# Patient Record
Sex: Male | Born: 1960
Health system: Southern US, Community
[De-identification: ages and names within clinical notes are randomized; demographics above are authoritative.]

## PROBLEM LIST (undated history)

## (undated) DIAGNOSIS — D369 Benign neoplasm, unspecified site: Secondary | ICD-10-CM

## (undated) DIAGNOSIS — F32A Depression, unspecified: Secondary | ICD-10-CM

## (undated) DIAGNOSIS — T7840XA Allergy, unspecified, initial encounter: Secondary | ICD-10-CM

## (undated) DIAGNOSIS — F329 Major depressive disorder, single episode, unspecified: Secondary | ICD-10-CM

## (undated) DIAGNOSIS — M7501 Adhesive capsulitis of right shoulder: Secondary | ICD-10-CM

## (undated) DIAGNOSIS — F419 Anxiety disorder, unspecified: Secondary | ICD-10-CM

## (undated) DIAGNOSIS — E039 Hypothyroidism, unspecified: Secondary | ICD-10-CM

## (undated) HISTORY — DX: Allergy, unspecified, initial encounter: T78.40XA

## (undated) HISTORY — DX: Benign neoplasm, unspecified site: D36.9

## (undated) HISTORY — PX: EYE SURGERY: SHX253

## (undated) HISTORY — DX: Adhesive capsulitis of right shoulder: M75.01

## (undated) HISTORY — DX: Anxiety disorder, unspecified: F41.9

## (undated) HISTORY — DX: Depression, unspecified: F32.A

## (undated) HISTORY — DX: Major depressive disorder, single episode, unspecified: F32.9

---

## 2004-09-16 ENCOUNTER — Encounter: Admission: RE | Admit: 2004-09-16 | Discharge: 2004-09-16 | Payer: Self-pay | Admitting: Internal Medicine

## 2007-02-06 ENCOUNTER — Ambulatory Visit: Payer: Self-pay | Admitting: Gastroenterology

## 2007-02-27 ENCOUNTER — Encounter (INDEPENDENT_AMBULATORY_CARE_PROVIDER_SITE_OTHER): Payer: Self-pay | Admitting: *Deleted

## 2007-02-27 ENCOUNTER — Ambulatory Visit: Payer: Self-pay | Admitting: Gastroenterology

## 2008-10-07 ENCOUNTER — Ambulatory Visit: Payer: Self-pay | Admitting: Internal Medicine

## 2009-03-31 ENCOUNTER — Ambulatory Visit: Payer: Self-pay | Admitting: Internal Medicine

## 2009-08-18 ENCOUNTER — Ambulatory Visit: Payer: Self-pay | Admitting: Family Medicine

## 2009-08-18 DIAGNOSIS — M25519 Pain in unspecified shoulder: Secondary | ICD-10-CM | POA: Insufficient documentation

## 2009-08-18 DIAGNOSIS — M7500 Adhesive capsulitis of unspecified shoulder: Secondary | ICD-10-CM | POA: Insufficient documentation

## 2009-10-06 ENCOUNTER — Ambulatory Visit: Payer: Self-pay | Admitting: Internal Medicine

## 2010-02-05 ENCOUNTER — Encounter (INDEPENDENT_AMBULATORY_CARE_PROVIDER_SITE_OTHER): Payer: Self-pay | Admitting: *Deleted

## 2010-03-23 ENCOUNTER — Encounter (INDEPENDENT_AMBULATORY_CARE_PROVIDER_SITE_OTHER): Payer: Self-pay | Admitting: *Deleted

## 2010-04-09 ENCOUNTER — Encounter (INDEPENDENT_AMBULATORY_CARE_PROVIDER_SITE_OTHER): Payer: Self-pay | Admitting: *Deleted

## 2010-04-13 ENCOUNTER — Ambulatory Visit: Payer: Self-pay | Admitting: Gastroenterology

## 2010-05-04 ENCOUNTER — Ambulatory Visit: Payer: Self-pay | Admitting: Gastroenterology

## 2010-05-06 ENCOUNTER — Encounter: Payer: Self-pay | Admitting: Gastroenterology

## 2010-05-11 ENCOUNTER — Ambulatory Visit: Payer: Self-pay | Admitting: Internal Medicine

## 2010-05-20 ENCOUNTER — Ambulatory Visit: Payer: Self-pay | Admitting: Emergency Medicine

## 2010-05-20 DIAGNOSIS — L255 Unspecified contact dermatitis due to plants, except food: Secondary | ICD-10-CM | POA: Insufficient documentation

## 2010-05-20 DIAGNOSIS — J309 Allergic rhinitis, unspecified: Secondary | ICD-10-CM | POA: Insufficient documentation

## 2010-05-20 DIAGNOSIS — F411 Generalized anxiety disorder: Secondary | ICD-10-CM | POA: Insufficient documentation

## 2010-05-21 ENCOUNTER — Telehealth: Payer: Self-pay | Admitting: Family Medicine

## 2010-12-29 NOTE — Letter (Signed)
Summary: Colonoscopy Letter  [facility]  [address] [place], [place] [address]   Phone: [phone]  Fax: [phone]      February 05, 2010 MRN: [id number]   CHARES SLAYMAKER [address] [place], [place]  [address]   Dear Mr. Mummert,   According to your medical record, it is time for you to schedule a Colonoscopy. The American Cancer Society recommends this procedure as a method to detect early colon cancer. Patients with a family history of colon cancer, or a personal history of colon polyps or inflammatory bowel disease are at increased risk.  This letter has beeen generated based on the recommendations made at the time of your procedure. If you feel that in your particular situation this may no longer apply, please contact our office.  Please call our office at [phone] to schedule this appointment or to update your records at your earliest convenience.  Thank you for cooperating with [place] to provide you with the very best care possible.   Sincerely,   Vania Rea. Jarold Motto, M.D.  [facility] Gastroenterology Division [phone]

## 2010-12-29 NOTE — Letter (Signed)
Summary: Previsit letter  [facility]  [address] [place], [place] [address]   Phone: [phone]  Fax: [phone]       03/23/2010 MRN: [id number]  Andrew Blake [address] [place], [place]  [address]  Dear [name],  Welcome to the Gastroenterology Division at [facility].    You are scheduled to see a nurse for your pre-procedure visit on 04/20/2010 at 8:00am on the 3rd floor at [facility], [address]  We ask that you try to arrive at our office 15 minutes prior to your appointment time to allow for check-in.  Your nurse visit will consist of discussing your medical and surgical history, your immediate family medical history, and your medications.    Please bring a complete list of all your medications or, if you prefer, bring the medication bottles and we will list them.  We will need to be aware of both prescribed and over the counter drugs.  We will need to know exact dosage information as well.  If you are on blood thinners (Coumadin, Plavix, Aggrenox, Ticlid, etc.) please call our office today/prior to your appointment, as we need to consult with your physician about holding your medication.   Please be prepared to read and sign documents such as consent forms, a financial agreement, and acknowledgement forms.  If necessary, and with your consent, a friend or relative is welcome to sit-in on the nurse visit with you.  Please bring your insurance card so that we may make a copy of it.  If your insurance requires a referral to see a specialist, please bring your referral form from your primary care physician.  No co-pay is required for this nurse visit.     If you cannot keep your appointment, please call [phone] to cancel or reschedule prior to your appointment date.  This allows [place] the opportunity to schedule an appointment for another patient in need of care.    Thank you for choosing [facility] for your medical  needs.  We appreciate the opportunity to care for you.  Please visit [place] at our website  to learn more about our practice.                     Sincerely.                                                                                                                   The Gastroenterology Division

## 2010-12-29 NOTE — Letter (Signed)
Summary: Patient Notice- Colon Biospy Results  [facility]  [address] [place], [place] [address]   Phone: [phone]  Fax: [phone]        May 06, 2010 MRN: [id number]    Andrew Blake [address] [place], [place]  [address]   x Dear [name],  I am pleased to inform you that the biopsies taken during your recent colonoscopy did not show any evidence of cancer upon pathologic examination.  Additional information/recommendations:  __No further action is needed at this time.  Please follow-up with      your primary care physician for your other healthcare needs.  __Please call [phone] to schedule a return visit to review      your condition.  _x_Continue with the treatment plan as outlined on the day of your      exam.Ileal biopsies were all normal.  __You should have a repeat colonoscopy examination for this problem           in 10_ years.  Please call us if you are having persistent problems or have questions about your condition that have not been fully answered at this time.  Sincerely,  Mardella Layman MD [facility]   This letter has been electronically signed by your physician.  Appended Document: Patient Notice- Colon Biospy Results letter mailed.

## 2010-12-29 NOTE — Letter (Signed)
Summary: [facility] Instructions  [facility]  [address] [place], [place] [address]   Phone: [phone]  Fax: [phone]       Andrew Blake    [birth year]    MRN: [id number]        Procedure Day /Date:  Monday   05/04/2010     Arrival Time: 7:30 am      Procedure Time: 8:30 am     Location of Procedure:                    _x _  [facility] (4th Floor)                        PREPARATION FOR COLONOSCOPY WITH MOVIPREP   Starting 5 days prior to your procedure  04/29/10 do not eat nuts, seeds, popcorn, corn, beans, peas,  salads, or any raw vegetables.  Do not take any fiber supplements (e.g. Metamucil, Citrucel, and Benefiber).  THE DAY BEFORE YOUR PROCEDURE         DATE: Sunday   05/03/10  1.  Drink clear liquids the entire day-NO SOLID FOOD  2.  Do not drink anything colored red or purple.  Avoid juices with pulp.  No orange juice.  3.  Drink at least 64 oz. (8 glasses) of fluid/clear liquids during the day to prevent dehydration and help the prep work efficiently.  CLEAR LIQUIDS INCLUDE: Water Jello Ice Popsicles Tea (sugar ok, no milk/cream) Powdered fruit flavored drinks Coffee (sugar ok, no milk/cream) Gatorade Juice: apple, white grape, white cranberry  Lemonade Clear bullion, consomm, broth Carbonated beverages (any kind) Strained chicken noodle soup Hard Candy                             4.  In the morning, mix first dose of MoviPrep solution:    Empty 1 Pouch A and 1 Pouch B into the disposable container    Add lukewarm drinking water to the top line of the container. Mix to dissolve    Refrigerate (mixed solution should be used within 24 hrs)  5.  Begin drinking the prep at 5:00 p.m. The MoviPrep container is divided by 4 marks.   Every 15 minutes drink the solution down to the next mark (approximately 8 oz) until the full liter is complete.   6.  Follow completed prep with 16 oz of clear liquid of your choice (Nothing  red or purple).  Continue to drink clear liquids until bedtime.  7.  Before going to bed, mix second dose of MoviPrep solution:    Empty 1 Pouch A and 1 Pouch B into the disposable container    Add lukewarm drinking water to the top line of the container. Mix to dissolve    Refrigerate  THE DAY OF YOUR PROCEDURE      DATE: Monday  05/04/10  Beginning at 3:30 a.m. (5 hours before procedure):         1. Every 15 minutes, drink the solution down to the next mark (approx 8 oz) until the full liter is complete.  2. Follow completed prep with 16 oz. of clear liquid of your choice.    3. You may drink clear liquids until 6:30 am (2 HOURS BEFORE PROCEDURE).   MEDICATION INSTRUCTIONS  Unless otherwise instructed, you should take regular prescription medications with a small sip of water   as early  as possible the morning of your procedure.          OTHER INSTRUCTIONS  You will need a responsible adult at least 50 years of age to accompany you and drive you home.   This person must remain in the waiting room during your procedure.  Wear loose fitting clothing that is easily removed.  Leave jewelry and other valuables at home.  However, you may wish to bring a book to read or  an iPod/MP3 player to listen to music as you wait for your procedure to start.  Remove all body piercing jewelry and leave at home.  Total time from sign-in until discharge is approximately 2-3 hours.  You should go home directly after your procedure and rest.  You can resume normal activities the  day after your procedure.  The day of your procedure you should not:   Drive   Make legal decisions   Operate machinery   Drink alcohol   Return to work  You will receive specific instructions about eating, activities and medications before you leave.    The above instructions have been reviewed and explained to me by   Wyona Almas RN  Apr 13, 2010 8:53 AM     I fully understand and can  verbalize these instructions _____________________________ Date _________

## 2010-12-29 NOTE — Assessment & Plan Note (Signed)
Summary: RIGHT SHOULDER PAIN/KH   Vital Signs:  Patient Profile:   50 Years Old Male CC:      Rt shoulder pain 4-5 months incresing in the last 2-3 weeks O2 Sat:      96 % O2 treatment:    Room Air Temp:     99.0 degrees F oral Pulse rate:   75 / minute Pulse rhythm:   regular Resp:     12 per minute BP sitting:   148 / 98  (right arm) Cuff size:   regular  Pt. in pain?   yes    Location:   shoulder    Intensity:   4    Type:       sharp                   Current Allergies: ! PENICILLINHistory of Present Illness Chief Complaint: Rt shoulder pain 4-5 months incresing in the last 2-3 weeks History of Present Illness: Subjective:  Patient complains of approximately 2 year history of gradual decreased range of motion in the right shoulder with loss of strength.  He states that he has difficulty raising his right arm above horizontal, and the shoulder occasionally feels as if it may give way when he has his right arm abducted.  He also frequently has a grinding or clicking in his shoulder with overhead movement.  The shoulder is uncomfortable at night when he lies on it.  His job involves repetitive motion but he recalls no shoulder injury in the past.  REVIEW OF SYSTEMS Constitutional Symptoms      Denies fever, chills, night sweats, weight loss, weight gain, and fatigue.  Eyes       Denies change in vision, eye pain, eye discharge, glasses, contact lenses, and eye surgery. Ear/Nose/Throat/Mouth       Denies hearing loss/aids, change in hearing, ear pain, ear discharge, dizziness, frequent runny nose, frequent nose bleeds, sinus problems, sore throat, hoarseness, and tooth pain or bleeding.  Respiratory       Denies dry cough, productive cough, wheezing, shortness of breath, asthma, bronchitis, and emphysema/COPD.  Cardiovascular       Denies murmurs, chest pain, and tires easily with exhertion.    Gastrointestinal       Denies stomach pain, nausea/vomiting, diarrhea,  constipation, blood in bowel movements, and indigestion. Genitourniary       Denies painful urination, kidney stones, and loss of urinary control. Neurological       Denies paralysis, seizures, and fainting/blackouts. Musculoskeletal       Complains of muscle pain, joint pain, joint stiffness, and decreased range of motion.      Denies redness, swelling, muscle weakness, and gout.  Skin       Denies bruising, unusual mles/lumps or sores, and hair/skin or nail changes.  Psych       Denies mood changes, temper/anger issues, anxiety/stress, speech problems, depression, and sleep problems.  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 There is an audible click in his right shoulder with active abduction above horizontal.  External rotation in the right shoulder is limited compared to the left, and he demonstrates less strength with resisted  external rotation.  There is tenderness over insertion of the biceps tendons.  Distal neurovascular intact  Assessment New Problems: ADHESIVE CAPSULITIS, RIGHT (ICD-726.0) SHOULDER PAIN, RIGHT, CHRONIC (ICD-719.41)   Plan New Orders: T-DG Shoulder*R* [[address]]  The patient and/or caregiver has been counseled thoroughly with regard to medications prescribed including dosage, schedule, interactions, rationale for use, and possible side effects and they verbalize understanding.  Diagnoses and expected course of recovery discussed and will return if not improved as expected or if the condition worsens. Patient and/or caregiver verbalized understanding.

## 2010-12-29 NOTE — Procedures (Signed)
Summary: Colonoscopy  Patient: Andrew Blake Note: All result statuses are Final unless otherwise noted.  Tests: (1) Colonoscopy (COL)   COL Colonoscopy           DONE     [facility]     [address]     [place], [place]  [address]           COLONOSCOPY PROCEDURE REPORT           PATIENT:  Andrew Blake, Andrew Blake  MR#:  [id number]     BIRTHDATE:  06/30/1961, 48 yrs. old  GENDER:  male     ENDOSCOPIST:  Vania Rea. Jarold Motto, MD, [facility]     REF. BY:     PROCEDURE DATE:  05/04/2010     PROCEDURE:  Colonoscopy with biopsy     ASA CLASS:  Class II     INDICATIONS:  history of pre-cancerous (adenomatous) colon polyps           MEDICATIONS:   Fentanyl 75 mcg IV, Versed 8 mg IV           DESCRIPTION OF PROCEDURE:   After the risks benefits and     alternatives of the procedure were thoroughly explained, informed     consent was obtained.  Digital rectal exam was performed and     revealed no abnormalities.   The LB [id number] endoscope     was introduced through the anus and advanced to the terminal ileum     which was intubated for a short distance, without limitations.     The quality of the prep was excellent, using MoviPrep.  The     instrument was then slowly withdrawn as the colon was fully     examined.     <<PROCEDUREIMAGES>>     FINDINGS:  erosions terminal ileum see pictures.scattered erosions     ans edematous IC valve biopsied.  Scattered diverticula were found     in the sigmoid colon.  No polyps or cancers were seen.  Internal     hemorrhoids were found.   Retroflexed views in the rect     um revealed no abnormalities.    The scope was then withdrawn from     the patient and the procedure completed.           COMPLICATIONS:  None     ENDOSCOPIC IMPRESSION:     1) Erosions in the terminal ileum     2) Diverticula, scattered in the sigmoid colon     3) No polyps or cancers     4) Internal hemorrhoids     ?? prep change,erosions from NSAID use, vs early IBD.   RECOMMENDATIONS:     1) Follow up colonoscopy in 5 years     2) Await biopsy results     REPEAT EXAM:  No           ______________________________     Vania Rea. Jarold Motto, MD, [facility]           CC:  Sharlet Salina, MD           n.     Rosalie Doctor:   Vania Rea. Patterson at 05/04/2010 09:02 AM           Milana Obey, [id number]  Note: An exclamation mark (!) indicates a result that was not dispersed into the flowsheet. Document Creation Date: 05/04/2010 9:02 AM _______________________________________________________________________  (1) Order result status: Final Collection or observation date-time: 05/04/2010 08:53 Requested date-time:  Receipt date-time:  Reported date-time:  Referring Physician:   Ordering Physician: Sheryn Bison [phone]) Specimen Source:  Source: Launa Grill Order Number: [id number] Lab site:   Appended Document: Colonoscopy     Procedures Next Due Date:    Colonoscopy: 04/2015

## 2010-12-29 NOTE — Assessment & Plan Note (Signed)
Summary: Rash- B arms x 1 wk rm 2   Vital Signs:  Patient Profile:   50 Years Old Male CC:      Rash - B arms x 1 wk Height:     70 inches Weight:      186 pounds O2 Sat:      99 % O2 treatment:    Room Air Temp:     98.2 degrees F oral Pulse rate:   74 / minute Pulse rhythm:   regular Resp:     16 per minute BP sitting:   137 / 86  (right arm) Cuff size:   regular  Vitals Entered By: Areta Haber CMA (May 20, 2010 6:47 PM)                  Current Allergies: ! PENICILLIN     History of Present Illness Chief Complaint: Rash - B arms x 1 wk History of Present Illness: Rash bilateral arms.  He developed poison ivy rash about 2 weeks ago, [facility].  It got better, then was working outside again and developed again. Very itchy and red.  OTC Caladryl helps.  Current Problems: POISON IVY DERMATITIS (ICD-692.6) ALLERGIC RHINITIS (ICD-477.9) ANXIETY (ICD-300.00) ADHESIVE CAPSULITIS, RIGHT (ICD-726.0) SHOULDER PAIN, RIGHT, CHRONIC (ICD-719.41)   Current Meds ZOLOFT 100 MG TABS (SERTRALINE HCL) 1 qd ZYRTEC HIVES RELIEF 10 MG TABS (CETIRIZINE HCL) 1 tab by mouth once daily PREDNISONE 10 MG TABS (PREDNISONE) sliding scale from [facility] 05/10/10 MEDROL (PAK) 4 MG TABS (METHYLPREDNISOLONE) Use as directed  REVIEW OF SYSTEMS Constitutional Symptoms      Denies fever, chills, night sweats, weight loss, weight gain, and fatigue.  Eyes       Denies change in vision, eye pain, eye discharge, glasses, contact lenses, and eye surgery. Ear/Nose/Throat/Mouth       Denies hearing loss/aids, change in hearing, ear pain, ear discharge, dizziness, frequent runny nose, frequent nose bleeds, sinus problems, sore throat, hoarseness, and tooth pain or bleeding.  Respiratory       Denies dry cough, productive cough, wheezing, shortness of breath, asthma, bronchitis, and emphysema/COPD.  Cardiovascular       Denies murmurs, chest pain, and tires  easily with exhertion.    Gastrointestinal       Denies stomach pain, nausea/vomiting, diarrhea, constipation, blood in bowel movements, and indigestion. Genitourniary       Denies painful urination, kidney stones, and loss of urinary control. Neurological       Denies paralysis, seizures, and fainting/blackouts. Musculoskeletal       Denies muscle pain, joint pain, joint stiffness, decreased range of motion, redness, swelling, muscle weakness, and gout.  Skin       Denies bruising, unusual mles/lumps or sores, and hair/skin or nail changes.      Comments: Dx Poison Ivy 05/10/10 B arms Psych       Denies mood changes, temper/anger issues, anxiety/stress, speech problems, depression, and sleep problems. Other Comments: Pt states he was seen at [facility] on 05/10/10 Dx Poison Ivy. Prescribed Prednisone - sliding scale. Pt states he thinks poison ivy is coming back. Pt has not seen his PCP for this.   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  Past Medical History: Cataracts Anxiety Allergic rhinitis Physical Exam  General appearance: well developed, well nourished, no acute distress Oral/Pharynx: tongue normal, posterior pharynx without erythema or exudate Chest/Lungs: no rales, wheezes, or rhonchi bilateral, breath sounds equal without effort Heart: regular rate and  rhythm, no murmur Skin: scattered linear excoriations and erythema on both forearms, no signs of infections, c/w poison ivy Assessment New Problems: POISON IVY DERMATITIS (ICD-692.6) ALLERGIC RHINITIS (ICD-477.9) ANXIETY (ICD-300.00)   Plan New Medications/Changes: MEDROL (PAK) 4 MG TABS (METHYLPREDNISOLONE) Use as directed  #1 pack x 0, 05/20/2010, Hoyt Koch MD  New Orders: New Patient  Level III [id number] Solumedrol up to 125mg  [J2930] Admin of Therapeutic Inj  intramuscular or subcutaneous [96372]  The patient and/or caregiver has been counseled thoroughly with regard to medications prescribed including dosage, schedule, interactions, rationale for use, and possible side effects and they verbalize understanding.  Diagnoses and expected course of recovery discussed and will return if not improved as expected or if the condition worsens. Patient and/or caregiver verbalized understanding.  Prescriptions: MEDROL (PAK) 4 MG TABS (METHYLPREDNISOLONE) Use as directed  #1 pack x 0   Entered and Authorized by:   Hoyt Koch MD   Signed by:   Hoyt Koch MD on 05/20/2010   Method used:   Printed then faxed to ...       [facility]  [place] [phone][facility]ail)       [address] [place], [place]  [address]       Ph: [phone] or [phone]       Fax: [phone]   RxID:   [id number]   Patient Instructions: 1)  Cool compresses 2)  Wear long-sleeve shirt at night 3)  Be sure to stay away from poison ivy for a few weeks 4)  Take the medicine as directed 5)  If still having recurrent problems, return to clinic 6)  OTC topical cortisone + Caladryl 7)  OTC zyrtec in the morning and benedryl at night  Medication Administration  Injection # 1:    Medication: Solumedrol up to 125mg     Diagnosis: POISON IVY DERMATITIS (ICD-692.6)    Route: IM    Site: RUOQ gluteus    Exp Date: 02/26/2010    Lot #: [id number]    Mfr: Pharmacia    Comments: Administered 125mg     Patient tolerated injection without complications    Given by: Areta Haber CMA (May 20, 2010 7:15 PM)  Orders Added: 1)  New Patient Level III [99203] 2)  Solumedrol up to 125mg  [J2930] 3)  Admin of Therapeutic Inj  intramuscular or subcutaneous [96372]   Medication Administration  Injection # 1:    Medication: Solumedrol up to 125mg     Diagnosis: POISON IVY DERMATITIS (ICD-692.6)    Route:  IM    Site: RUOQ gluteus    Exp Date: 02/26/2010    Lot #: [id number]    Mfr: Pharmacia    Comments: Administered 125mg     Patient tolerated injection without complications    Given by: Areta Haber CMA (May 20, 2010 7:15 PM)  Orders Added: 1)  New Patient Level III [99203] 2)  Solumedrol up to 125mg  [J2930] 3)  Admin of Therapeutic Inj  intramuscular or subcutaneous [84166]

## 2010-12-29 NOTE — Progress Notes (Signed)
  Phone Note From Pharmacy   [facility]  [place] [phone][facility] Call For: Physician  Summary of Call: [facility] different [facility]ed/clld in for pt due to they do not have Medrol in stock. Pls advise.  Initial call taken by: Areta Haber CMA,  May 21, 2010 10:28 AM    New/Updated Medications: PREDNISONE 10 MG TABS (PREDNISONE) 2 PO BID for 3 days, then 1 BID for 2 days, then 1 daily for 2 days.  Take PC Prescriptions: PREDNISONE 10 MG TABS (PREDNISONE) 2 PO BID for 3 days, then 1 BID for 2 days, then 1 daily for 2 days.  Take PC  #18 x 0   Entered and Authorized by:   Donna Christen MD   Signed by:   Donna Christen MD on 05/21/2010   Method used:   Electronically to        [facility] [phone]* (retail)       [address] [place], [place]  [address]       Ph: [phone] or [phone]       Fax: [phone]   RxID:   [id number]  Rx sent Donna Christen MD  May 21, 2010 10:42 AM

## 2010-12-29 NOTE — Miscellaneous (Signed)
Summary: [facility] Previsit/prep  Clinical Lists Changes  Medications: Added new medication of MOVIPREP 100 GM  SOLR (PEG-KCL-NACL-NASULF-NA ASC-C) As per prep instructions. - Signed Rx of MOVIPREP 100 GM  SOLR (PEG-KCL-NACL-NASULF-NA ASC-C) As per prep instructions.;  #1 x 0;  Signed;  Entered by: Wyona Almas RN;  Authorized by: Mardella Layman MD [facility];  Method used: [facility] [phone]*, [address], [place], [place]  [address], Ph: [phone] or [phone], Fax: [phone] Observations: Added new observation of ALLERGY REV: Done (04/13/2010 8:22)    Prescriptions: MOVIPREP 100 GM  SOLR (PEG-KCL-NACL-NASULF-NA ASC-C) As per prep instructions.  #1 x 0   Entered by:   Wyona Almas RN   Authorized by:   Mardella Layman MD [facility]   Signed by:   Wyona Almas RN on 04/13/2010   Method used:   [facility]ically to        [facility] [phone]* (retail)       [address] [place], [place]  [address]       Ph: [phone] or [phone]       Fax: [phone]   RxID:   [id number]

## 2011-04-19 ENCOUNTER — Ambulatory Visit (INDEPENDENT_AMBULATORY_CARE_PROVIDER_SITE_OTHER): Payer: BC Managed Care – PPO | Admitting: Internal Medicine

## 2011-04-19 ENCOUNTER — Encounter: Payer: Self-pay | Admitting: Internal Medicine

## 2011-04-19 VITALS — BP 126/84 | HR 84 | Temp 99.1°F | Ht 67.5 in | Wt 185.0 lb

## 2011-04-19 DIAGNOSIS — F32A Depression, unspecified: Secondary | ICD-10-CM

## 2011-04-19 DIAGNOSIS — Z0000 Encounter for general adult medical examination without abnormal findings: Secondary | ICD-10-CM

## 2011-04-19 DIAGNOSIS — F329 Major depressive disorder, single episode, unspecified: Secondary | ICD-10-CM

## 2011-04-19 LAB — POCT URINALYSIS DIPSTICK
Bilirubin, UA: NEGATIVE
Blood, UA: NEGATIVE
Glucose, UA: NEGATIVE
Ketones, UA: NEGATIVE
Leukocytes, UA: NEGATIVE
Nitrite, UA: NEGATIVE
Protein, UA: NEGATIVE
Spec Grav, UA: 1.01
Urobilinogen, UA: NEGATIVE
pH, UA: 5

## 2011-04-19 LAB — LIPID PANEL
Cholesterol: 174 mg/dL (ref 0–200)
HDL: 37 mg/dL — ABNORMAL LOW (ref >39)
LDL Cholesterol: 113 mg/dL — ABNORMAL HIGH (ref 0–99)
Total CHOL/HDL Ratio: 4.7 Ratio
Triglycerides: 120 mg/dL (ref <150)
VLDL: 24 mg/dL (ref 0–40)

## 2011-04-19 LAB — CBC WITH DIFFERENTIAL/PLATELET
Basophils Absolute: 0 10*3/uL (ref 0.0–0.1)
Basophils Relative: 0 % (ref 0–1)
Eosinophils Absolute: 0.1 10*3/uL (ref 0.0–0.7)
Eosinophils Relative: 2 % (ref 0–5)
HCT: 48.4 % (ref 39.0–52.0)
Hemoglobin: 15.8 g/dL (ref 13.0–17.0)
Lymphocytes Relative: 35 % (ref 12–46)
Lymphs Abs: 2.5 10*3/uL (ref 0.7–4.0)
MCH: 30.3 pg (ref 26.0–34.0)
MCHC: 32.6 g/dL (ref 30.0–36.0)
MCV: 92.7 fL (ref 78.0–100.0)
Monocytes Absolute: 0.6 10*3/uL (ref 0.1–1.0)
Monocytes Relative: 9 % (ref 3–12)
Neutro Abs: 3.9 10*3/uL (ref 1.7–7.7)
Neutrophils Relative %: 55 % (ref 43–77)
Platelets: 218 10*3/uL (ref 150–400)
RBC: 5.22 MIL/uL (ref 4.22–5.81)
RDW: 13.1 % (ref 11.5–15.5)
WBC: 7.2 10*3/uL (ref 4.0–10.5)

## 2011-04-19 LAB — COMPREHENSIVE METABOLIC PANEL
ALT: 23 U/L (ref 0–53)
AST: 19 U/L (ref 0–37)
Albumin: 4.8 g/dL (ref 3.5–5.2)
Alkaline Phosphatase: 66 U/L (ref 39–117)
BUN: 15 mg/dL (ref 6–23)
CO2: 22 mEq/L (ref 19–32)
Calcium: 9.7 mg/dL (ref 8.4–10.5)
Chloride: 106 mEq/L (ref 96–112)
Creat: 0.97 mg/dL (ref 0.40–1.50)
Glucose, Bld: 92 mg/dL (ref 70–99)
Potassium: 4.4 mEq/L (ref 3.5–5.3)
Sodium: 139 mEq/L (ref 135–145)
Total Bilirubin: 1.1 mg/dL (ref 0.3–1.2)
Total Protein: 7.2 g/dL (ref 6.0–8.3)

## 2011-04-19 LAB — PSA: PSA: 0.31 ng/mL (ref <=4.00)

## 2011-04-19 MED ORDER — SERTRALINE HCL 100 MG PO TABS: 100.0000 mg | ORAL_TABLET | Freq: Every day | ORAL | Status: DC

## 2011-04-19 NOTE — Progress Notes (Signed)
  Subjective:    Patient ID: Andrew Blake, male    DOB: 03/20/1961, 50 y.o.   MRN: [id number]  HPI    Review of Systems     Objective:   Physical Exam        Assessment & Plan:  1- Depresssion-stable on current regimen. Zoloft refilled for one year 2- Allergic rhinitis-stable no refills needed now. 3-Left upper arm musculoskeletal pain- to see Dr. Teressa Senter 4- Feet pain- to see Dr. Roanna Epley

## 2011-04-19 NOTE — Progress Notes (Signed)
  Subjective:    Patient ID: Andrew Blake, male    DOB: 1961/07/24, 50 y.o.   MRN: [id number]  HPI  50 year old W male for CPE. Hx depression treated with Zoloft. Having problem with left shoulder biceps area for 2 months. Has seen Dr. Teressa Senter before so refer back to him. Also, feet hurt a lot.suspect arthritis but may need orthotics since he is on his feet a lot so refer to Dr. Roanna Epley. Phone number given to pt. Hx of allergic rhinitis stable on Zyrtec and Flonase.    Review of Systems  Constitutional: Negative for activity change, appetite change and fatigue.  HENT: Positive for congestion, rhinorrhea and sneezing. Negative for postnasal drip.   Eyes: Negative for photophobia, redness and visual disturbance.  Respiratory: Negative.   Cardiovascular: Negative.   Gastrointestinal: Negative.   Genitourinary: Negative for dysuria, frequency, discharge and enuresis.  Musculoskeletal: Positive for myalgias.  Neurological: Negative.   Hematological: Negative.   Psychiatric/Behavioral: Negative for confusion, dysphoric mood, decreased concentration and agitation.  Full ROM left shoulder but pain left deltoid x 2 months.     Objective:   Physical Exam  Constitutional: He appears well-nourished.  HENT:  Head: Normocephalic and atraumatic.  Right Ear: External ear normal.  Left Ear: External ear normal.  Eyes: Conjunctivae and EOM are normal. Pupils are equal, round, and reactive to light. Right eye exhibits discharge. Left eye exhibits no discharge.  Neck: Normal range of motion. Neck supple. No JVD present. No thyromegaly present.  Cardiovascular: Normal rate, regular rhythm and normal heart sounds.  Exam reveals no gallop.   No murmur heard. Pulmonary/Chest: Effort normal and breath sounds normal. He has no wheezes. He has no rales.  Abdominal: He exhibits no distension and no mass. There is no tenderness. There is no rebound and no guarding.  Genitourinary: Prostate normal.    Musculoskeletal: Normal range of motion. He exhibits tenderness. He exhibits no edema.       Tender left biceps area.  Lymphadenopathy:    He has no cervical adenopathy.  Skin: No rash noted. No erythema.  Psychiatric: He has a normal mood and affect. His behavior is normal. Judgment and thought content normal.          Assessment & Plan:

## 2011-04-19 NOTE — Patient Instructions (Addendum)
Continue current meds as directed. Return in one year. See Dr. Teressa Senter for your left arm soreness and Dr. Roanna Epley for foor pain

## 2011-04-20 ENCOUNTER — Encounter: Payer: Self-pay | Admitting: Internal Medicine

## 2011-11-08 ENCOUNTER — Ambulatory Visit: Payer: BC Managed Care – PPO | Admitting: Sports Medicine

## 2011-12-13 ENCOUNTER — Ambulatory Visit: Payer: BC Managed Care – PPO | Admitting: Sports Medicine

## 2011-12-14 ENCOUNTER — Other Ambulatory Visit: Payer: Self-pay | Admitting: Internal Medicine

## 2011-12-15 ENCOUNTER — Other Ambulatory Visit: Payer: Self-pay

## 2011-12-15 MED ORDER — FLUTICASONE PROPIONATE 50 MCG/ACT NA SUSP: 2.0000 | Freq: Every day | NASAL | Status: DC

## 2011-12-28 ENCOUNTER — Other Ambulatory Visit: Payer: Self-pay

## 2011-12-28 DIAGNOSIS — F329 Major depressive disorder, single episode, unspecified: Secondary | ICD-10-CM

## 2011-12-28 DIAGNOSIS — F32A Depression, unspecified: Secondary | ICD-10-CM

## 2011-12-28 MED ORDER — FLUTICASONE PROPIONATE 50 MCG/ACT NA SUSP: 2.0000 | Freq: Every day | NASAL | Status: DC

## 2011-12-28 MED ORDER — SERTRALINE HCL 100 MG PO TABS: 100.0000 mg | ORAL_TABLET | Freq: Every day | ORAL | Status: DC

## 2011-12-28 MED ORDER — SERTRALINE HCL 100 MG PO TABS
100.0000 mg | ORAL_TABLET | Freq: Every day | ORAL | Status: DC
Start: 2011-12-28 — End: 2012-06-05

## 2012-01-17 ENCOUNTER — Ambulatory Visit (INDEPENDENT_AMBULATORY_CARE_PROVIDER_SITE_OTHER): Payer: Managed Care, Other (non HMO) | Admitting: Sports Medicine

## 2012-01-17 DIAGNOSIS — M722 Plantar fascial fibromatosis: Secondary | ICD-10-CM

## 2012-01-17 DIAGNOSIS — R269 Unspecified abnormalities of gait and mobility: Secondary | ICD-10-CM

## 2012-01-17 NOTE — Progress Notes (Signed)
  Subjective:    Patient ID: Andrew Blake, male    DOB: Aug 28, 1961, 51 y.o.   MRN: [id number]  HPI 51 y/o male is here with bilateral foot pain worsening over the past 6 months.  The pain is located in the heels and through the arches.   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  He doesn't really have the pain while standing.  It seems that after sitting for a while the pain comes with standing.  On his days off he doesn't really have the pain.  Also, getting new shoes helps the pain but he is needing to do this about every 3 months.  Aleve helps the pain.  He has never been seen by a physician.  He's tried over the counter inserts but these don't help.  He has noticed the same sort of pain pattern with his knees. This pain is mostly located behind the knee cap.  He occasionally gets swelling but not very often.  He has no history of injury of the knees or feet.  He has been told that he has some arthritis of the knees.   Review of Systems     Objective:   Physical Exam  Knees: No effusion or deformity Crepitus bilaterally No tenderness to palpation Normal ROM Decreased abductor and quad strength bilaterally Left leg longer than right, less than a cm No pain with hip rotation, lateral tightness with internal rotation  Feet: Tenderness to palpation at the insertion of the plantar fascia bilaterally No swelling at this site Longitudinal arch is decreased bilaterally, right greater than left Transverse arch is lost bilaterally, left greater than right There is splaying between 1st and 2nd toes especially on the left foot The heels are narrow bilaterally Posterior tibialis function is normal  Gait: Eversion of the right foot which is improved with insoles but not resolved completely  Ultrasound: Right plantar fascia 0.74 cm and left plantar fascia 0.63cm both measured at the medial aspect of the insertion.  More laterally and distally the plantar fascia is a normal  thickness bilaterally.  There is spurring noted bilaterally, right greater than left.      Assessment & Plan:

## 2012-01-17 NOTE — Patient Instructions (Signed)
1. Wear your insoles in your shoes for work.  2. Do your exercise and ice at least once daily.  3. Follow up in 6 weeks.

## 2012-01-19 DIAGNOSIS — R269 Unspecified abnormalities of gait and mobility: Secondary | ICD-10-CM | POA: Insufficient documentation

## 2012-01-19 DIAGNOSIS — M722 Plantar fascial fibromatosis: Secondary | ICD-10-CM | POA: Insufficient documentation

## 2012-01-19 NOTE — Assessment & Plan Note (Signed)
His symptoms are likely secondary to standing on hard surfaces with inadequate foot support.  We have added insoles for him today.  He will try these.  This should also help his knee pain as they will provide more cushion than his normal insoles.  If not he will let us know and we will address it separately.  He will also ice and do his HEP daily.

## 2012-01-19 NOTE — Assessment & Plan Note (Signed)
We added a lift to the right shoe to help correct the discrepancy.

## 2012-02-14 ENCOUNTER — Emergency Department
Admission: EM | Admit: 2012-02-14 | Discharge: 2012-02-14 | Disposition: A | Discharge status: Home / Self Care | Payer: Self-pay | Source: Home / Self Care | Attending: Family Medicine | Admitting: Family Medicine

## 2012-02-14 ENCOUNTER — Encounter: Payer: Self-pay | Admitting: *Deleted

## 2012-02-14 DIAGNOSIS — J309 Allergic rhinitis, unspecified: Secondary | ICD-10-CM

## 2012-02-14 DIAGNOSIS — J0100 Acute maxillary sinusitis, unspecified: Secondary | ICD-10-CM

## 2012-02-14 MED ORDER — AZITHROMYCIN 250 MG PO TABS: ORAL_TABLET | ORAL | Status: AC

## 2012-02-14 MED ORDER — PREDNISONE 10 MG PO TABS: ORAL_TABLET | ORAL | Status: DC

## 2012-02-14 NOTE — ED Notes (Signed)
Pt c/o nasal congestion and sinus pain/pressure x 3-4 wks. Denies fever. He has taken claritin and afrin with some relief.

## 2012-02-14 NOTE — Discharge Instructions (Signed)
Take plain Mucinex (guaifenesin) twice daily for cough and congestion.  Increase fluid intake, rest. May use Afrin nasal spray (or generic oxymetazoline) twice daily for about 5 days.  Also recommend using saline nasal spray several times daily and saline nasal irrigation (AYR is a common brand) Stop all antihistamines for now, and other non-prescription cough/cold preparations.     

## 2012-02-14 NOTE — ED Provider Notes (Signed)
History     CSN: [id number]  Arrival date & time 02/14/12  0847   First MD Initiated Contact with Patient 02/14/12 [phone]      Chief Complaint  Patient presents with  . Nasal Congestion      HPI Comments: Patient complains of onset of a cold-like illness about 3 to 4 weeks ago with typical sore throat and sinus congestion, followed by a cough.  Most of the symptoms resolved except for persistent sinus congestion.  Over the past several days he has developed persistent facial pain.  No fevers, chills, and sweats.  He has seasonal allergies, and had mild nasal congestion prior to onset of his cold.  His congestion is not responding to topical decongestant, Flonase, and Claritin.   The history is provided by the patient.    Past Medical History  Diagnosis Date  . Anxiety   . Allergy   . Adenomatous polyp   . Adhesive capsulitis of right shoulder     Past Surgical History  Procedure Date  . Eye surgery     cataracts ou    History reviewed. No pertinent family history.  History  Substance Use Topics  . Smoking status: Former Smoker -- 1.0 packs/day for 10 years    Types: Cigarettes    Quit date: 04/18/1985  . Smokeless tobacco: Never Used  . Alcohol Use: 7.2 oz/week    12 Cans of beer per week      Review of Systems + sore throat, resolved + cough, resolved No pleuritic pain No wheezing + nasal congestion + post-nasal drainage + sinus pain/pressure No itchy/red eyes No earache No hemoptysis No SOB No fever/chills No nausea No vomiting No abdominal pain No diarrhea No urinary symptoms No skin rashes No fatigue No myalgias + headache (facial pressure) Used OTC meds without relief (Claritin) Allergies  Penicillins  Home Medications   Current Outpatient Rx  Name Route Sig Dispense Refill  . ASPIRIN 81 MG PO TBEC Oral Take 81 mg by mouth daily.      . AZITHROMYCIN 250 MG PO TABS  Take 2 tabs today; then begin one tab once daily for 4 more days. 6 each  0  . CETIRIZINE HCL 10 MG PO TABS Oral Take 10 mg by mouth daily.      Marland Kitchen FLUTICASONE PROPIONATE 50 MCG/ACT NA SUSP Nasal Place 2 sprays into the nose daily. 16 g 3  . PREDNISONE 10 MG PO TABS  Take two tabs by mouth twice daily for three days, then one tab twice daily for 2 days, then 1 tab daily for two days.  Take PC 18 tablet 0  . SERTRALINE HCL 100 MG PO TABS Oral Take 1 tablet (100 mg total) by mouth daily. 90 tablet 1    BP 147/88  Pulse 74  Temp(Src) 99.6 F (37.6 C) (Oral)  Resp 16  Ht 5\' 9"  (1.753 m)  Wt 186 lb 8 oz (84.596 kg)  BMI 27.54 kg/m2  SpO2 96%  Physical Exam Nursing notes and Vital Signs reviewed. Appearance:  Patient appears healthy, stated age, and in no acute distress Eyes:  Pupils are equal, round, and reactive to light and accomodation.  Extraocular movement is intact.  Conjunctivae are not inflamed  Ears:  Canals normal.  Tympanic membranes normal.  Nose:  Mildly congested turbinates.  Maxillary sinus tenderness is present.  Pharynx:  Normal Neck:  Supple.  Slightly tender shotty posterior nodes are palpated bilaterally  Lungs:  Clear to auscultation.  Breath sounds are equal.  Heart:  Regular rate and rhythm without murmurs, rubs, or gallops.  Skin:  No rash present.   ED Course  Procedures  none      1. Acute maxillary sinusitis   2. Allergic rhinitis       MDM  Begin a Z-pack and tapering course of prednisone. Take plain Mucinex (guaifenesin) twice daily for cough and congestion.  Increase fluid intake, rest. May use Afrin nasal spray (or generic oxymetazoline) twice daily for about 5 days.  Also recommend using saline nasal spray several times daily and saline nasal irrigation (AYR is a common brand) Stop all antihistamines for now, and other non-prescription cough/cold preparations. Continue Flonase as prescribed. Followup with ENT if not improved one week        Lattie Haw, MD 02/14/12 [phone]

## 2012-02-28 ENCOUNTER — Ambulatory Visit: Payer: Managed Care, Other (non HMO) | Admitting: Sports Medicine

## 2012-03-27 ENCOUNTER — Encounter: Payer: Self-pay | Admitting: Sports Medicine

## 2012-03-27 ENCOUNTER — Ambulatory Visit (INDEPENDENT_AMBULATORY_CARE_PROVIDER_SITE_OTHER): Payer: Managed Care, Other (non HMO) | Admitting: Sports Medicine

## 2012-03-27 VITALS — BP 142/88 | HR 75

## 2012-03-27 DIAGNOSIS — M722 Plantar fascial fibromatosis: Secondary | ICD-10-CM

## 2012-03-27 DIAGNOSIS — R269 Unspecified abnormalities of gait and mobility: Secondary | ICD-10-CM

## 2012-03-27 NOTE — Assessment & Plan Note (Signed)
Symptoms are mostly resolved with sports insoles.  Discussed custom orthotics vs. Continuing with the sports insoles.  He would like to continue with sports insoles.  We have made an additional pair with heel lifts bilaterally and metatarsal bars.  We have also given him an order form so he can order more supplies PRN.  Expect that his pain will fully resolve but if it doesn't he will follow up with [place].

## 2012-03-27 NOTE — Progress Notes (Signed)
  Subjective:    Patient ID: Andrew Blake, male    DOB: 07/21/1961, 50 y.o.   MRN: [id number]  HPI 51 y/o male is here for follow up for plantar fasciitis.  His pain is much improved, mostly resolved with wearing his sports insoles.  He had some knee pain at his last visit which is also mostly resolved.  No new complaints.   Review of Systems     Objective:   Physical Exam  Feet:  No tenderness to palpation at the insertion of the plantar fascia bilaterally  No swelling at this site  Longitudinal arch is decreased bilaterally, right greater than left  Transverse arch is lost bilaterally, left greater than right  There is splaying between 1st and 2nd toes especially on the left foot  The heels are narrow bilaterally  Posterior tibialis function is normal       Assessment & Plan:

## 2012-03-27 NOTE — Assessment & Plan Note (Signed)
We made him a second pair of temporary orthotics today.  He will follow up with [place] when he is need of a third pair.  Symptoms will be his guide.

## 2012-04-17 ENCOUNTER — Ambulatory Visit (INDEPENDENT_AMBULATORY_CARE_PROVIDER_SITE_OTHER): Payer: Managed Care, Other (non HMO) | Admitting: Physician Assistant

## 2012-04-17 ENCOUNTER — Encounter: Payer: Self-pay | Admitting: Physician Assistant

## 2012-04-17 ENCOUNTER — Telehealth: Payer: Self-pay | Admitting: Physician Assistant

## 2012-04-17 VITALS — BP 145/89 | HR 81 | Ht 70.0 in | Wt 180.0 lb

## 2012-04-17 DIAGNOSIS — IMO0001 Reserved for inherently not codable concepts without codable children: Secondary | ICD-10-CM

## 2012-04-17 DIAGNOSIS — F411 Generalized anxiety disorder: Secondary | ICD-10-CM

## 2012-04-17 DIAGNOSIS — F32A Depression, unspecified: Secondary | ICD-10-CM

## 2012-04-17 DIAGNOSIS — R030 Elevated blood-pressure reading, without diagnosis of hypertension: Secondary | ICD-10-CM

## 2012-04-17 DIAGNOSIS — Z0000 Encounter for general adult medical examination without abnormal findings: Secondary | ICD-10-CM

## 2012-04-17 DIAGNOSIS — Z13220 Encounter for screening for lipoid disorders: Secondary | ICD-10-CM

## 2012-04-17 DIAGNOSIS — Z131 Encounter for screening for diabetes mellitus: Secondary | ICD-10-CM

## 2012-04-17 DIAGNOSIS — F329 Major depressive disorder, single episode, unspecified: Secondary | ICD-10-CM

## 2012-04-17 DIAGNOSIS — F419 Anxiety disorder, unspecified: Secondary | ICD-10-CM

## 2012-04-17 DIAGNOSIS — R7989 Other specified abnormal findings of blood chemistry: Secondary | ICD-10-CM

## 2012-04-17 LAB — COMPLETE METABOLIC PANEL WITH GFR
ALT: 16 U/L (ref 0–53)
AST: 15 U/L (ref 0–37)
Albumin: 4.5 g/dL (ref 3.5–5.2)
Alkaline Phosphatase: 55 U/L (ref 39–117)
BUN: 12 mg/dL (ref 6–23)
CO2: 24 mEq/L (ref 19–32)
Calcium: 9.9 mg/dL (ref 8.4–10.5)
Chloride: 107 mEq/L (ref 96–112)
Creat: 0.95 mg/dL (ref 0.50–1.35)
GFR, Est African American: >89 mL/min
GFR, Est Non African American: >89 mL/min
Glucose, Bld: 83 mg/dL (ref 70–99)
Potassium: 4.3 mEq/L (ref 3.5–5.3)
Sodium: 138 mEq/L (ref 135–145)
Total Bilirubin: 0.8 mg/dL (ref 0.3–1.2)
Total Protein: 7 g/dL (ref 6.0–8.3)

## 2012-04-17 LAB — LIPID PANEL
Cholesterol: 155 mg/dL (ref 0–200)
HDL: 40 mg/dL (ref >39)
LDL Cholesterol: 93 mg/dL (ref 0–99)
Total CHOL/HDL Ratio: 3.9 Ratio
Triglycerides: 110 mg/dL (ref <150)
VLDL: 22 mg/dL (ref 0–40)

## 2012-04-17 NOTE — Patient Instructions (Addendum)
Taper off of Zoloft 50 mg for 1 week then 25mg  for 1 week then start Viibryd pack. Call if need refill before complete physical in 6weeks. Call if depression worsens. Continue to exercise daily and watch salt intake.

## 2012-04-17 NOTE — Progress Notes (Signed)
  Subjective:    Patient ID: Andrew Blake, male    DOB: 09-12-1961, 51 y.o.   MRN: [id number]  HPI Patient presents to the clinic today as a new patient. PMH was reviewed. Patient needs a CPE; however, he wants to discuss worsening depression and anxiety today and reschedule CPE. Patient has had depression for over 22 years. He has been on Effexor but stopped working and then was put on Zoloft. Zoloft has done great for 10 years. Over the last 9 months he notices that he doesn't find pleasure in doing the things he once did. He is also very anxious. He denies panic attacks but does have generalized all day anxiety. He has not had any particular situation or event that has happened in the last 9 months but he has changed pharmacy's with where he gets his Zoloft. He denies suicidal thoughts and he has been sleeping very well.   Blood pressure is elevated today. Patient reports that it has not been in the past and he does check periodically. Denies HA, CP, or palpitations.   Review of Systems     Objective:   Physical Exam  Constitutional: He is oriented to person, place, and time. He appears well-developed and well-nourished.  HENT:  Head: Normocephalic and atraumatic.  Cardiovascular: Normal rate, regular rhythm and normal heart sounds.   Pulmonary/Chest: Effort normal and breath sounds normal. He has no wheezes.  Neurological: He is alert and oriented to person, place, and time.  Skin: Skin is warm and dry.  Psychiatric:       Seems to be doing well today. Reports anxiety and depression but appears well dressed today and very coherent.           Assessment & Plan:  Anxiety and Depression-GAD-7 was 17 and PHQ-9 was 16. Taper off of Zoloft 50 mg for 1 week then 25mg  for 1 week then start Viibryd pack. Call if need refill before complete physical in 6weeks. Call if depression worsens.   Elevated blood pressure-Will recheck at next visit. Patient reports that his blood pressure has not  been elevated before and when he checks it is usually in the 120's/80's. Continue to exercise daily and watch salt intake.     Labs were given for patient to get today for CPE that we will schedule for 6 weeks when we follow up on Depression.

## 2012-04-17 NOTE — Telephone Encounter (Signed)
Andrew Blake, will you call patient and let him know to go ahead and start Viibryd sample pack when on 25mg (1/4 of 100) of Zoloft taper. I had told him not too but I think this would give him better coverage.

## 2012-04-18 LAB — VITAMIN B12: Vitamin B-12: 391 pg/mL (ref 211–911)

## 2012-04-18 LAB — VITAMIN D 25 HYDROXY (VIT D DEFICIENCY, FRACTURES): Vit D, 25-Hydroxy: 35 ng/mL (ref 30–89)

## 2012-04-18 LAB — TSH: TSH: 8.762 u[IU]/mL — ABNORMAL HIGH (ref 0.350–4.500)

## 2012-04-18 NOTE — Telephone Encounter (Signed)
LMOM informing Pt  

## 2012-04-20 NOTE — Progress Notes (Signed)
Addended by: Ellsworth Lennox on: 04/20/2012 11:23 AM   Modules accepted: Orders

## 2012-04-21 ENCOUNTER — Telehealth: Payer: Self-pay | Admitting: *Deleted

## 2012-04-21 DIAGNOSIS — E039 Hypothyroidism, unspecified: Secondary | ICD-10-CM | POA: Insufficient documentation

## 2012-04-21 LAB — T3: T3, Total: 97.7 ng/dL (ref 80.0–204.0)

## 2012-04-21 LAB — T4: T4, Total: 5.4 ug/dL (ref 5.0–12.5)

## 2012-04-21 MED ORDER — LEVOTHYROXINE SODIUM 100 MCG PO CAPS: ORAL_CAPSULE | ORAL | Status: DC

## 2012-04-21 NOTE — Telephone Encounter (Signed)
Pt has called asking what medication you were putting him on for his thyroid. Please advise.

## 2012-04-21 NOTE — Telephone Encounter (Signed)
Andrew Blake is to call patient with results and to let him know how to take medication.

## 2012-05-01 ENCOUNTER — Encounter: Payer: Managed Care, Other (non HMO) | Admitting: Internal Medicine

## 2012-05-15 ENCOUNTER — Telehealth: Payer: Self-pay | Admitting: *Deleted

## 2012-05-15 MED ORDER — VILAZODONE HCL 40 MG PO TABS: 40.0000 mg | ORAL_TABLET | Freq: Every day | ORAL | Status: DC

## 2012-05-15 NOTE — Telephone Encounter (Signed)
viibryd 40mg  sent to pharm

## 2012-05-22 ENCOUNTER — Encounter: Payer: Managed Care, Other (non HMO) | Admitting: Physician Assistant

## 2012-05-29 ENCOUNTER — Encounter: Payer: Managed Care, Other (non HMO) | Admitting: Physician Assistant

## 2012-06-02 ENCOUNTER — Encounter: Payer: Self-pay | Admitting: Physician Assistant

## 2012-06-02 ENCOUNTER — Ambulatory Visit (INDEPENDENT_AMBULATORY_CARE_PROVIDER_SITE_OTHER): Payer: Managed Care, Other (non HMO) | Admitting: Physician Assistant

## 2012-06-02 VITALS — BP 141/88 | HR 77 | Ht 70.0 in | Wt 181.0 lb

## 2012-06-02 DIAGNOSIS — Z23 Encounter for immunization: Secondary | ICD-10-CM

## 2012-06-02 DIAGNOSIS — F419 Anxiety disorder, unspecified: Secondary | ICD-10-CM

## 2012-06-02 DIAGNOSIS — Z0000 Encounter for general adult medical examination without abnormal findings: Secondary | ICD-10-CM

## 2012-06-02 DIAGNOSIS — F411 Generalized anxiety disorder: Secondary | ICD-10-CM

## 2012-06-02 DIAGNOSIS — E039 Hypothyroidism, unspecified: Secondary | ICD-10-CM

## 2012-06-02 LAB — TSH: TSH: 2.723 u[IU]/mL (ref 0.350–4.500)

## 2012-06-02 LAB — T4, FREE: Free T4: 1 ng/dL (ref 0.80–1.80)

## 2012-06-02 LAB — PSA: PSA: 0.38 ng/mL (ref <=4.00)

## 2012-06-02 NOTE — Progress Notes (Signed)
  Subjective:    Patient ID: Andrew Blake, male    DOB: 1961-04-26, 51 y.o.   MRN: [id number]  HPI Patient presents to the clinic to have CPE today. He was recently diagnosed with hypothyroidism and placed on levothyroxin 100 MCG daily. He was also tapered off his Zoloft and placed on Viibryd. He's not sure which one it is making him feels anxious and jittery but this has been increased since his last visit. He has also had trouble sleeping. He has not had these problems in the past. The reason why Zoloft was changed was because he had been on it for numerous years and it felt like it was losing efficacy. Patient denies exercising or making any real diet changes.   Review of Systems     Objective:   Physical Exam  BP 141/88  Pulse 77  Ht 5\' 10"  (1.778 m)  Wt 181 lb (82.101 kg)  BMI 25.97 kg/m2  SpO2 99%  General Appearance:    Alert, cooperative, no distress, appears stated age  Head:    Normocephalic, without obvious abnormality, atraumatic male patterned baldness  Eyes:    PERRL, conjunctiva/corneas clear, EOM's intact, fundi    benign, both eyes       Ears:    Normal TM's and external ear canals, both ears  Nose:   Nares normal, septum midline, mucosa normal, no drainage    or sinus tenderness  Throat:   Lips, mucosa, and tongue normal; teeth and gums normal  Neck:   Supple, symmetrical, trachea midline, no adenopathy;       thyroid:  No enlargement/tenderness/nodules; no carotid   bruit or JVD  Back:     Symmetric, no curvature, ROM normal, no CVA tenderness  Lungs:     Clear to auscultation bilaterally, respirations unlabored  Chest wall:    No tenderness or deformity  Heart:    Regular rate and rhythm, S1 and S2 normal, no murmur, rub   or gallop  Abdomen:     Soft, non-tender, bowel sounds active all four quadrants,    no masses, no organomegaly  Genitalia:    Normal male without lesion, discharge or tenderness  Rectal:    Normal tone, normal prostate, no masses or  tenderness;   guaiac negative stool  Extremities:   Extremities normal, atraumatic, no cyanosis or edema  Pulses:   2+ and symmetric all extremities  Skin:   Skin color, texture, turgor normal, no rashes or lesions  Lymph nodes:   Cervical, supraclavicular, and axillary nodes normal  Neurologic:   CNII-XII intact. Normal strength, sensation and reflexes      throughout      Assessment & Plan:    CPE/hypothryoidism/Anxiety- Will recheck thyroid today and see if any changes need to be made to levothyroxine. Will call with PSA and cholesterol results. Melatonin can try to use to an hour at bedtime. If still feels anxious and not sleeping will change anti-depressant. Encouraged patient to have a goal of150 min of exercise a week this helps with overall health and anxiety. Consider for surgery of calcium/dairy daily or a calcium supplement 500 mg twice a day. Maintain a healthy diet. Vaccines up to date. Zostavax was given today. Followup in 4-6 weeks with anxiety.

## 2012-06-02 NOTE — Patient Instructions (Addendum)
Will recheck thyroid today and see if any changes need to be made to levothyroxine. Will call with PSA results. Melatonin can try to use to an hour at bedtime. If still feels anxious and not sleeping will change anti-depressant. 150 min of exercise a week.

## 2012-06-05 ENCOUNTER — Other Ambulatory Visit: Payer: Self-pay | Admitting: Physician Assistant

## 2012-06-05 DIAGNOSIS — F329 Major depressive disorder, single episode, unspecified: Secondary | ICD-10-CM

## 2012-06-05 DIAGNOSIS — F32A Depression, unspecified: Secondary | ICD-10-CM

## 2012-06-05 MED ORDER — SERTRALINE HCL 25 MG PO TABS: ORAL_TABLET | ORAL | Status: DC

## 2012-06-05 MED ORDER — LEVOTHYROXINE SODIUM 100 MCG PO CAPS: ORAL_CAPSULE | ORAL | Status: DC

## 2012-06-08 ENCOUNTER — Other Ambulatory Visit: Payer: Self-pay | Admitting: Physician Assistant

## 2012-07-21 ENCOUNTER — Other Ambulatory Visit: Payer: Self-pay | Admitting: *Deleted

## 2012-07-21 MED ORDER — SERTRALINE HCL 100 MG PO TABS: ORAL_TABLET | ORAL | Status: DC

## 2012-08-26 ENCOUNTER — Other Ambulatory Visit: Payer: Self-pay | Admitting: Physician Assistant

## 2012-10-05 ENCOUNTER — Other Ambulatory Visit: Payer: Self-pay | Admitting: Physician Assistant

## 2012-11-05 ENCOUNTER — Other Ambulatory Visit: Payer: Self-pay | Admitting: Physician Assistant

## 2012-12-04 ENCOUNTER — Other Ambulatory Visit: Payer: Self-pay | Admitting: *Deleted

## 2012-12-04 MED ORDER — LEVOTHYROXINE SODIUM 100 MCG PO CAPS: ORAL_CAPSULE | ORAL | Status: DC

## 2012-12-04 MED ORDER — SERTRALINE HCL 100 MG PO TABS: 100.0000 mg | ORAL_TABLET | Freq: Every day | ORAL | Status: DC

## 2013-01-29 ENCOUNTER — Other Ambulatory Visit: Payer: Self-pay | Admitting: Internal Medicine

## 2013-03-12 ENCOUNTER — Other Ambulatory Visit: Payer: Self-pay | Admitting: Physician Assistant

## 2013-03-26 ENCOUNTER — Ambulatory Visit (INDEPENDENT_AMBULATORY_CARE_PROVIDER_SITE_OTHER): Payer: Managed Care, Other (non HMO) | Admitting: Physician Assistant

## 2013-03-26 ENCOUNTER — Encounter: Payer: Self-pay | Admitting: Physician Assistant

## 2013-03-26 VITALS — BP 142/87 | HR 65 | Wt 188.0 lb

## 2013-03-26 DIAGNOSIS — Z0000 Encounter for general adult medical examination without abnormal findings: Secondary | ICD-10-CM

## 2013-03-26 DIAGNOSIS — E039 Hypothyroidism, unspecified: Secondary | ICD-10-CM

## 2013-03-26 DIAGNOSIS — F32A Depression, unspecified: Secondary | ICD-10-CM

## 2013-03-26 DIAGNOSIS — Z131 Encounter for screening for diabetes mellitus: Secondary | ICD-10-CM

## 2013-03-26 DIAGNOSIS — F411 Generalized anxiety disorder: Secondary | ICD-10-CM

## 2013-03-26 DIAGNOSIS — Z13220 Encounter for screening for lipoid disorders: Secondary | ICD-10-CM

## 2013-03-26 DIAGNOSIS — F329 Major depressive disorder, single episode, unspecified: Secondary | ICD-10-CM

## 2013-03-26 MED ORDER — BUSPIRONE HCL 7.5 MG PO TABS: 7.5000 mg | ORAL_TABLET | Freq: Two times a day (BID) | ORAL | Status: DC

## 2013-03-26 MED ORDER — SERTRALINE HCL 100 MG PO TABS: ORAL_TABLET | ORAL | Status: DC

## 2013-03-26 MED ORDER — LEVOTHYROXINE SODIUM 100 MCG PO CAPS: ORAL_CAPSULE | ORAL | Status: DC

## 2013-03-26 MED ORDER — FLUTICASONE PROPIONATE 50 MCG/ACT NA SUSP: 2.0000 | Freq: Every day | NASAL | Status: DC

## 2013-03-27 NOTE — Progress Notes (Signed)
  Subjective:    Patient ID: Andrew Blake, male    DOB: 1961-08-22, 52 y.o.   MRN: [id number]  HPI Patient presents to the clinic to follow up on hypothyroidism, depression , and Anxiety.   Hypothyroidism- controlled via last labs. Need CPE. Taking meds as directed.   Anxiety/depression- uncontrolled. Feels like on zoloft 150mg  he is 50 percent controlled. Symptoms are mostly anxiety. Depression is well controlled. He did not respond well to vibryd;therefore, does not want to do any medication changes. Denies panic attacks but rather feels anxious all the time. He did start exercising and has helped a lot.    Review of Systems     Objective:   Physical Exam  Constitutional: He is oriented to person, place, and time. He appears well-developed and well-nourished.  HENT:  Head: Normocephalic and atraumatic.  Neck: Normal range of motion. Neck supple.  Cardiovascular: Normal rate, regular rhythm and normal heart sounds.   Pulmonary/Chest: Effort normal and breath sounds normal.  Lymphadenopathy:    He has no cervical adenopathy.  Neurological: He is alert and oriented to person, place, and time.  Skin: Skin is warm and dry.  Psychiatric: He has a normal mood and affect. His behavior is normal.          Assessment & Plan:  Hypothyroidism- Will recheck labs. Gave refill.   Anxiety/Depression- Continue to exercise. Suggested counseling. He reported that doesn't work. Increased zoloft to 200mg  and added buspar 7.5mg  BID. Follow up in 2 months.    Labs slip given for CPE in 2 months.

## 2013-05-17 ENCOUNTER — Other Ambulatory Visit: Payer: Self-pay | Admitting: Physician Assistant

## 2013-06-16 ENCOUNTER — Other Ambulatory Visit: Payer: Self-pay | Admitting: Physician Assistant

## 2013-06-18 ENCOUNTER — Other Ambulatory Visit: Payer: Self-pay | Admitting: Physician Assistant

## 2013-06-18 MED ORDER — SERTRALINE HCL 100 MG PO TABS: ORAL_TABLET | ORAL | Status: DC

## 2013-06-18 NOTE — Progress Notes (Signed)
[facility] requested rx to be faxed to pharmacy.

## 2013-06-19 ENCOUNTER — Other Ambulatory Visit: Payer: Self-pay | Admitting: Physician Assistant

## 2013-06-19 LAB — COMPREHENSIVE METABOLIC PANEL
ALT: 15 U/L (ref 0–53)
AST: 14 U/L (ref 0–37)
Albumin: 4.5 g/dL (ref 3.5–5.2)
Alkaline Phosphatase: 75 U/L (ref 39–117)
BUN: 19 mg/dL (ref 6–23)
CO2: 24 mEq/L (ref 19–32)
Calcium: 9.6 mg/dL (ref 8.4–10.5)
Chloride: 105 mEq/L (ref 96–112)
Creat: 0.94 mg/dL (ref 0.50–1.35)
Glucose, Bld: 100 mg/dL — ABNORMAL HIGH (ref 70–99)
Potassium: 4.8 mEq/L (ref 3.5–5.3)
Sodium: 139 mEq/L (ref 135–145)
Total Bilirubin: 0.9 mg/dL (ref 0.3–1.2)
Total Protein: 7.2 g/dL (ref 6.0–8.3)

## 2013-06-19 LAB — CBC
HCT: 47.6 % (ref 39.0–52.0)
Hemoglobin: 16.5 g/dL (ref 13.0–17.0)
MCH: 30.4 pg (ref 26.0–34.0)
MCHC: 34.7 g/dL (ref 30.0–36.0)
MCV: 87.8 fL (ref 78.0–100.0)
Platelets: 213 10*3/uL (ref 150–400)
RBC: 5.42 MIL/uL (ref 4.22–5.81)
RDW: 13.2 % (ref 11.5–15.5)
WBC: 6.8 10*3/uL (ref 4.0–10.5)

## 2013-06-19 LAB — LIPID PANEL
Cholesterol: 177 mg/dL (ref 0–200)
HDL: 38 mg/dL — ABNORMAL LOW (ref >39)
LDL Cholesterol: 122 mg/dL — ABNORMAL HIGH (ref 0–99)
Total CHOL/HDL Ratio: 4.7 Ratio
Triglycerides: 85 mg/dL (ref <150)
VLDL: 17 mg/dL (ref 0–40)

## 2013-06-19 LAB — TSH: TSH: 6.525 u[IU]/mL — ABNORMAL HIGH (ref 0.350–4.500)

## 2013-06-19 LAB — PSA: PSA: 0.4 ng/mL (ref <=4.00)

## 2013-06-20 ENCOUNTER — Other Ambulatory Visit: Payer: Self-pay | Admitting: Physician Assistant

## 2013-06-20 LAB — HEMOGLOBIN A1C
Hgb A1c MFr Bld: 5.5 % (ref <5.7)
Mean Plasma Glucose: 111 mg/dL (ref <117)

## 2013-06-20 MED ORDER — LEVOTHYROXINE SODIUM 112 MCG PO TABS: 112.0000 ug | ORAL_TABLET | Freq: Every day | ORAL | Status: DC

## 2013-06-22 ENCOUNTER — Other Ambulatory Visit: Payer: Self-pay | Admitting: *Deleted

## 2013-06-22 MED ORDER — LEVOTHYROXINE SODIUM 112 MCG PO TABS: 112.0000 ug | ORAL_TABLET | Freq: Every day | ORAL | Status: DC

## 2013-06-22 NOTE — Progress Notes (Signed)
Reordered [facility] local [facility]

## 2013-06-25 ENCOUNTER — Ambulatory Visit (INDEPENDENT_AMBULATORY_CARE_PROVIDER_SITE_OTHER): Payer: BC Managed Care – PPO | Admitting: Physician Assistant

## 2013-06-25 ENCOUNTER — Encounter: Payer: Self-pay | Admitting: Physician Assistant

## 2013-06-25 VITALS — BP 139/85 | HR 73 | Wt 183.0 lb

## 2013-06-25 DIAGNOSIS — F411 Generalized anxiety disorder: Secondary | ICD-10-CM

## 2013-06-25 DIAGNOSIS — F32A Depression, unspecified: Secondary | ICD-10-CM

## 2013-06-25 DIAGNOSIS — Z0000 Encounter for general adult medical examination without abnormal findings: Secondary | ICD-10-CM

## 2013-06-25 DIAGNOSIS — E039 Hypothyroidism, unspecified: Secondary | ICD-10-CM

## 2013-06-25 DIAGNOSIS — F329 Major depressive disorder, single episode, unspecified: Secondary | ICD-10-CM

## 2013-06-25 MED ORDER — SERTRALINE HCL 100 MG PO TABS: ORAL_TABLET | ORAL | Status: DC

## 2013-06-25 MED ORDER — BUSPIRONE HCL 7.5 MG PO TABS: 7.5000 mg | ORAL_TABLET | Freq: Two times a day (BID) | ORAL | Status: DC

## 2013-06-25 NOTE — Progress Notes (Signed)
  Subjective:    Patient ID: Andrew Blake, male    DOB: [birth year], 52 y.o.   MRN: [id number]  HPI Patient presents to the clinic today for physical. No new complaints today. He just started increased dose of synthyroid for last 3 days. Feels like energy is improving some. Depression and Anxiety is controlled. He is trying to stay active and eating a balanced diet.     Review of Systems  All other systems reviewed and are negative.       Objective:   Physical Exam BP 139/85  Pulse 73  Wt 183 lb (83.008 kg)  BMI 26.26 kg/m2  General Appearance:    Alert, cooperative, no distress, appears stated age  Head:    Normocephalic, without obvious abnormality, atraumatic  Eyes:    PERRL, conjunctiva/corneas clear, EOM's intact, fundi    benign, both eyes       Ears:    Normal TM's and external ear canals, both ears  Nose:   Nares normal, septum midline, mucosa normal, no drainage    or sinus tenderness  Throat:   Lips, mucosa, and tongue normal; teeth and gums normal  Neck:   Supple, symmetrical, trachea midline, no adenopathy;       thyroid:  No enlargement/tenderness/nodules; no carotid   bruit or JVD  Back:     Symmetric, no curvature, ROM normal, no CVA tenderness  Lungs:     Clear to auscultation bilaterally, respirations unlabored  Chest wall:    No tenderness or deformity  Heart:    Regular rate and rhythm, S1 and S2 normal, no murmur, rub   or gallop  Abdomen:     Soft, non-tender, bowel sounds active all four quadrants,    no masses, no organomegaly  Genitalia:    Pt declined. Not done.  Rectal:    Pt declined. Not done.  Extremities:   Extremities normal, atraumatic, no cyanosis or edema  Pulses:   2+ and symmetric all extremities  Skin:   Skin color, texture, turgor normal, no rashes or lesions  Lymph nodes:   Cervical, supraclavicular, and axillary nodes normal  Neurologic:   CNII-XII intact. Normal strength, sensation and reflexes      throughout          Assessment & Plan:  CPE- Labs already done and discussed. Will recheck TSH in 8 weeks to make sure increased dose is appropriate. Zoloft and buspar were refilled. Regular exercise and balanced diet encouraged. Colonoscopy up to date. Vaccines up to date. Gave handout on things to look for as he ages. PSA was normal and denies any urinary symptoms consistent with BPH.

## 2013-06-25 NOTE — Patient Instructions (Signed)

## 2013-08-09 ENCOUNTER — Other Ambulatory Visit: Payer: Self-pay | Admitting: Physician Assistant

## 2013-09-03 ENCOUNTER — Telehealth: Payer: Self-pay | Admitting: *Deleted

## 2013-09-03 DIAGNOSIS — E039 Hypothyroidism, unspecified: Secondary | ICD-10-CM

## 2013-09-04 LAB — TSH: TSH: 4.524 u[IU]/mL — ABNORMAL HIGH (ref 0.350–4.500)

## 2013-09-05 ENCOUNTER — Other Ambulatory Visit: Payer: Self-pay | Admitting: Physician Assistant

## 2013-09-05 MED ORDER — LEVOTHYROXINE SODIUM 125 MCG PO CAPS: ORAL_CAPSULE | ORAL | Status: DC

## 2013-11-27 ENCOUNTER — Other Ambulatory Visit: Payer: Self-pay | Admitting: Physician Assistant

## 2013-12-19 ENCOUNTER — Telehealth: Payer: Self-pay | Admitting: *Deleted

## 2013-12-19 DIAGNOSIS — E039 Hypothyroidism, unspecified: Secondary | ICD-10-CM

## 2013-12-19 NOTE — Telephone Encounter (Signed)
Pt left message asking if he needs to have his TSH rechecked now since his levothyroxine dose was changed. Please advise

## 2013-12-19 NOTE — Telephone Encounter (Signed)
Left detailed message on vm.

## 2013-12-19 NOTE — Telephone Encounter (Signed)
Ok to send TSH and free T4 down to lab for recheck.

## 2014-01-02 LAB — TSH: TSH: 5.817 u[IU]/mL — ABNORMAL HIGH (ref 0.350–4.500)

## 2014-01-02 LAB — T4, FREE: Free T4: 1.18 ng/dL (ref 0.80–1.80)

## 2014-01-03 ENCOUNTER — Other Ambulatory Visit: Payer: Self-pay | Admitting: Physician Assistant

## 2014-01-03 MED ORDER — LEVOTHYROXINE SODIUM 150 MCG PO TABS: 150.0000 ug | ORAL_TABLET | Freq: Every day | ORAL | Status: DC

## 2014-01-14 ENCOUNTER — Other Ambulatory Visit: Payer: Self-pay | Admitting: *Deleted

## 2014-01-14 MED ORDER — FLUTICASONE PROPIONATE 50 MCG/ACT NA SUSP: 2.0000 | Freq: Every day | NASAL | Status: DC

## 2014-01-22 ENCOUNTER — Other Ambulatory Visit: Payer: Self-pay | Admitting: Physician Assistant

## 2014-01-22 ENCOUNTER — Telehealth: Payer: Self-pay | Admitting: *Deleted

## 2014-01-22 MED ORDER — LEVOTHYROXINE SODIUM 137 MCG PO TABS: 137.0000 ug | ORAL_TABLET | Freq: Every day | ORAL | Status: DC

## 2014-01-22 NOTE — Telephone Encounter (Signed)
Pt left message last night stating that he felt better on the 112 mcg dose of levothyroxine than he does the increase to 150 mcg.  He stated that he feels like his energy levels are dropping, he has trouble focusing, & feels that his depression has worsened.  He wants to know if he can drop back down. Please advise.

## 2014-01-22 NOTE — Telephone Encounter (Signed)
I sent over a in between dose of 137. Let's try that first.

## 2014-01-22 NOTE — Telephone Encounter (Signed)
Left detailed message on vm.

## 2014-02-24 ENCOUNTER — Emergency Department (INDEPENDENT_AMBULATORY_CARE_PROVIDER_SITE_OTHER)
Admission: EM | Admit: 2014-02-24 | Discharge: 2014-02-24 | Disposition: A | Discharge status: Home / Self Care | Payer: BC Managed Care – PPO | Source: Home / Self Care | Attending: Family Medicine | Admitting: Family Medicine

## 2014-02-24 ENCOUNTER — Encounter: Payer: Self-pay | Admitting: Emergency Medicine

## 2014-02-24 DIAGNOSIS — H6121 Impacted cerumen, right ear: Secondary | ICD-10-CM

## 2014-02-24 DIAGNOSIS — E039 Hypothyroidism, unspecified: Secondary | ICD-10-CM | POA: Insufficient documentation

## 2014-02-24 DIAGNOSIS — H6120 Impacted cerumen, unspecified ear: Secondary | ICD-10-CM

## 2014-02-24 HISTORY — DX: Hypothyroidism, unspecified: E03.9

## 2014-02-24 NOTE — ED Provider Notes (Signed)
CSN: [id number]     Arrival date & time 02/24/14  1152 History   First MD Initiated Contact with Patient 02/24/14 1207     Chief Complaint  Patient presents with  . Ear Fullness    x 6 days      HPI Comments: Patient complains of right ear feeling clogged for 6 days.  He feels well otherwise.  The history is provided by the patient.    Past Medical History  Diagnosis Date  . Anxiety   . Allergy   . Adenomatous polyp   . Adhesive capsulitis of right shoulder   . Depression   . Hypothyroid    Past Surgical History  Procedure Laterality Date  . Eye surgery      cataracts ou   Family History  Problem Relation Age of Onset  . Hypertension Father   . Depression Father   . Depression Brother   . Depression Brother   . Heart attack Paternal Grandmother   . Stroke Paternal Grandfather    History  Substance Use Topics  . Smoking status: Former Smoker -- 1.00 packs/day for 10 years    Types: Cigarettes    Quit date: 02/27/1990  . Smokeless tobacco: Never Used  . Alcohol Use: 7.2 oz/week    12 Cans of beer per week    Review of Systems No sore throat No cough No pleuritic pain No wheezing No nasal congestion No post-nasal drainage No sinus pain/pressure No itchy/red eyes No earache, but right ear feels clogged No hemoptysis No SOB No fever/chills No nausea No vomiting No abdominal pain No diarrhea No urinary symptoms No skin rash No fatigue No myalgias No headache    Allergies  Penicillins  Home Medications   Current Outpatient Rx  Name  Route  Sig  Dispense  Refill  . aspirin 81 MG EC tablet   Oral   Take 81 mg by mouth daily.           . fluticasone (FLONASE) 50 MCG/ACT nasal spray   Each Nare   Place 2 sprays into both nostrils daily.   16 g   6   . levothyroxine (SYNTHROID, LEVOTHROID) 137 MCG tablet   Oral   Take 1 tablet (137 mcg total) by mouth daily before breakfast.   30 tablet   1   . loratadine (CLARITIN) 10 MG tablet  Oral   Take 10 mg by mouth daily.         . Multiple Vitamins-Minerals (MULTIVITAMIN PO)   Oral   Take by mouth.         . sertraline (ZOLOFT) 100 MG tablet      Take 2 tablets daily for Depression.   180 tablet   1   . busPIRone (BUSPAR) 7.5 MG tablet   Oral   Take 1 tablet (7.5 mg total) by mouth 2 (two) times daily.   180 tablet   0    BP 142/88  Pulse 77  Temp(Src) 98.2 F (36.8 C) (Oral)  Resp 17  Ht 5\' 9"  (1.753 m)  Wt 185 lb (83.915 kg)  BMI 27.31 kg/m2  SpO2 97% Physical Exam Nursing notes and Vital Signs reviewed. Appearance:  Patient appears healthy, stated age, and in no acute distress Eyes:  Pupils are equal, round, and reactive to light and accomodation.  Extraocular movement is intact.  Conjunctivae are not inflamed  Ears:   Right canal occluded with cerumen.  Left canal and tympanic membrane normal  Nose:  Normal turbinates.  No sinus tenderness.  Mouth/Pharynx:  Normal Neck:  Supple.  No adenopathy Skin:  No rash present.   ED Course  Procedures  Right ear lavage:  Post lavage, right canal and tympanic membrane appear normal     MDM   1. Right ear impacted cerumen    Call if ear drainage or pain occurs    Kandra Nicolas, MD 02/26/14 1011

## 2014-02-24 NOTE — ED Notes (Signed)
Denys complains of pressure/fullness in his right ear for 6 days. Denies fever, chills, sweats or cold symptoms.

## 2014-02-24 NOTE — Discharge Instructions (Signed)
Cerumen Plug A cerumen plug is having too much wax in your ear canal. The outer ear canal is lined with hairs and glands that secrete wax. This wax is called cerumen. This protects the ear canal. It also helps prevent material from entering the ear. Too much wax can cause a feeling of fullness in the ears, decreased hearing, ringing in the ears, or an earache. Sometimes your caregiver will remove a cerumen plug with an instrument called a curette. Or he/she may flush the ear canal with warm water from a syringe to remove the wax. You may simply be sent home to follow the home care instructions below for wax removal. Generally ear wax does not have to be removed unless it is causing a problem such as one of those listed above. When too much wax is causing a problem, the following are a few home remedies which can be used to help this problem. HOME CARE INSTRUCTIONS   Put a couple drops of glycerin, baby oil, or mineral oil in the ear a couple times of day. Do this every day for several days. After putting the drops in, you will need to lay with the affected ear pointing up for a couple minutes. This allows the drops to remain in the canal and run down to the area of wax blockage. This will soften the wax plug. It may also make your hearing worse as the wax softens and blocks the canal even more.  After a couple days, you may gently flush the ear canal with warm water from a syringe. Do this by pulling your ear up and back with your head tilted slightly forward and towards a pan to catch the water. This is most easily done with a helper. You can also accomplish the same thing by letting the shower beat into your ear canal to wash the wax out. Sometimes this will not be immediately successful. You will have to return to the first step of using the oil to further soften the wax. Then resume washing the ear canal out with a syringe or shower.  Following removal of the wax, put ten to twenty drops of rubbing  alcohol into the outer ears. This will dry the canal and prevent an infection.  Do not irrigate or wash out your ears if you have had a perforated ear drum or mastoid surgery. SEEK IMMEDIATE MEDICAL CARE IF:   You are unsuccessful with the above instructions for home care.  You develop ear pain or drainage from the ear. MAKE SURE YOU:   Understand these instructions.  Will watch your condition.  Will get help right away if you are not doing well or get worse. Document Released: 08/10/2001 Document Revised: 02/07/2012 Document Reviewed: 11/06/2008 [employer] Patient Information 2014 [employer].  

## 2014-03-16 ENCOUNTER — Other Ambulatory Visit: Payer: Self-pay | Admitting: Physician Assistant

## 2014-05-14 ENCOUNTER — Other Ambulatory Visit: Payer: Self-pay | Admitting: Physician Assistant

## 2014-06-04 ENCOUNTER — Telehealth: Payer: Self-pay | Admitting: *Deleted

## 2014-06-04 DIAGNOSIS — E038 Other specified hypothyroidism: Secondary | ICD-10-CM

## 2014-06-04 DIAGNOSIS — Z0000 Encounter for general adult medical examination without abnormal findings: Secondary | ICD-10-CM

## 2014-06-04 NOTE — Telephone Encounter (Signed)
Labs ordered for upcoming CPE. 

## 2014-06-11 LAB — COMPLETE METABOLIC PANEL WITH GFR
ALT: 19 U/L (ref 0–53)
AST: 19 U/L (ref 0–37)
Albumin: 4.6 g/dL (ref 3.5–5.2)
Alkaline Phosphatase: 72 U/L (ref 39–117)
BUN: 19 mg/dL (ref 6–23)
CO2: 21 mEq/L (ref 19–32)
Calcium: 9.5 mg/dL (ref 8.4–10.5)
Chloride: 104 mEq/L (ref 96–112)
Creat: 0.96 mg/dL (ref 0.50–1.35)
GFR, Est African American: >89 mL/min
GFR, Est Non African American: >89 mL/min
Glucose, Bld: 85 mg/dL (ref 70–99)
Potassium: 4.5 mEq/L (ref 3.5–5.3)
Sodium: 139 mEq/L (ref 135–145)
Total Bilirubin: 1.1 mg/dL (ref 0.2–1.2)
Total Protein: 7.2 g/dL (ref 6.0–8.3)

## 2014-06-11 LAB — LIPID PANEL
Cholesterol: 154 mg/dL (ref 0–200)
HDL: 42 mg/dL (ref >39)
LDL Cholesterol: 87 mg/dL (ref 0–99)
Total CHOL/HDL Ratio: 3.7 Ratio
Triglycerides: 123 mg/dL (ref <150)
VLDL: 25 mg/dL (ref 0–40)

## 2014-06-11 LAB — T4, FREE: Free T4: 1.25 ng/dL (ref 0.80–1.80)

## 2014-06-11 LAB — PSA: PSA: 0.41 ng/mL (ref <=4.00)

## 2014-06-11 LAB — TSH: TSH: 2.442 u[IU]/mL (ref 0.350–4.500)

## 2014-06-11 NOTE — Telephone Encounter (Signed)
Quick Note:  All labs are normal. ______ 

## 2014-06-13 ENCOUNTER — Encounter: Payer: BC Managed Care – PPO | Admitting: Physician Assistant

## 2014-06-18 ENCOUNTER — Ambulatory Visit (INDEPENDENT_AMBULATORY_CARE_PROVIDER_SITE_OTHER): Payer: BC Managed Care – PPO | Admitting: Physician Assistant

## 2014-06-18 ENCOUNTER — Encounter: Payer: Self-pay | Admitting: Physician Assistant

## 2014-06-18 VITALS — BP 134/81 | HR 73 | Ht 69.0 in | Wt 183.0 lb

## 2014-06-18 DIAGNOSIS — E038 Other specified hypothyroidism: Secondary | ICD-10-CM | POA: Diagnosis not present

## 2014-06-18 DIAGNOSIS — Z0000 Encounter for general adult medical examination without abnormal findings: Secondary | ICD-10-CM

## 2014-06-18 DIAGNOSIS — F411 Generalized anxiety disorder: Secondary | ICD-10-CM | POA: Diagnosis not present

## 2014-06-18 DIAGNOSIS — F3289 Other specified depressive episodes: Secondary | ICD-10-CM

## 2014-06-18 DIAGNOSIS — M25561 Pain in right knee: Secondary | ICD-10-CM

## 2014-06-18 DIAGNOSIS — F32A Depression, unspecified: Secondary | ICD-10-CM

## 2014-06-18 DIAGNOSIS — M25569 Pain in unspecified knee: Secondary | ICD-10-CM

## 2014-06-18 DIAGNOSIS — F329 Major depressive disorder, single episode, unspecified: Secondary | ICD-10-CM | POA: Diagnosis not present

## 2014-06-18 MED ORDER — SERTRALINE HCL 100 MG PO TABS: ORAL_TABLET | ORAL | Status: DC

## 2014-06-18 MED ORDER — LEVOTHYROXINE SODIUM 137 MCG PO TABS: ORAL_TABLET | ORAL | Status: DC

## 2014-06-18 MED ORDER — FLUTICASONE PROPIONATE 50 MCG/ACT NA SUSP: 2.0000 | Freq: Every day | NASAL | Status: DC

## 2014-06-18 NOTE — Patient Instructions (Addendum)
Keeping you healthy  Get these tests  Blood pressure- Have your blood pressure checked once a year by your healthcare provider.  Normal blood pressure is 120/80  Weight- Have your body mass index (BMI) calculated to screen for obesity.  BMI is a measure of body fat based on height and weight. You can also calculate your own BMI at [email or web address].  Cholesterol- Have your cholesterol checked every year.  Diabetes- Have your blood sugar checked regularly if you have high blood pressure, high cholesterol, have a family history of diabetes or if you are overweight.  Screening for Colon Cancer- Colonoscopy starting at age 10.  Screening may begin sooner depending on your family history and other health conditions. Follow up colonoscopy as directed by your Gastroenterologist.  Screening for Prostate Cancer- Both blood work (PSA) and a rectal exam help screen for Prostate Cancer.  Screening begins at age 16 with African-American men and at age 81 with Caucasian men.  Screening may begin sooner depending on your family history.  Take these medicines  Aspirin- One aspirin daily can help prevent Heart disease and Stroke.  Flu shot- Every fall.  Tetanus- Every 10 years.  Zostavax- Once after the age of 25 to prevent Shingles.  Pneumonia shot- Once after the age of 17; if you are younger than 36, ask your healthcare provider if you need a Pneumonia shot.  Take these steps  Don't smoke- If you do smoke, talk to your doctor about quitting.  For tips on how to quit, go to www.smokefree.gov or call 1-800-QUIT-NOW.  Be physically active- Exercise 5 days a week for at least 30 minutes.  If you are not already physically active start slow and gradually work up to 30 minutes of moderate physical activity.  Examples of moderate activity include walking briskly, mowing the yard, dancing, swimming, bicycling, etc.  Eat a healthy diet- Eat a variety of healthy food such as fruits, vegetables, low  fat milk, low fat cheese, yogurt, lean meant, poultry, fish, beans, tofu, etc. For more information go to www.thenutritionsource.org  Drink alcohol in moderation- Limit alcohol intake to less than two drinks a day. Never drink and drive.  Dentist- Brush and floss twice daily; visit your dentist twice a year.  Depression- Your emotional health is as important as your physical health. If you're feeling down, or losing interest in things you would normally enjoy please talk to your healthcare provider.  Eye exam- Visit your eye doctor every year.  Safe sex- If you may be exposed to a sexually transmitted infection, use a condom.  Seat belts- Seat belts can save your life; always wear one.  Smoke/Carbon Monoxide detectors- These detectors need to be installed on the appropriate level of your home.  Replace batteries at least once a year.  Skin cancer- When out in the sun, cover up and use sunscreen 15 SPF or higher.  Violence- If anyone is threatening you, please tell your healthcare provider. Living Will/ Health care power of attorney- Speak with your healthcare provider and family.  Meniscus Tear with Phase I Rehab The meniscus is a C-shaped cartilage structure, located in the knee joint between the thigh bone (femur) and the shinbone (tibia). Two menisci are located in each knee joint: the inner and outer meniscus. The meniscus acts as an adapter between the thigh bone and shinbone, allowing them to fit properly together. It also functions as a shock absorber, to reduce the stress placed on the knee joint and to help supply nutrients  to the knee joint cartilage. As people age, the meniscus begins to harden and become more vulnerable to injury. Meniscus tears are a common injury, especially in older athletes. Inner meniscus tears are more common than outer meniscus tears.  SYMPTOMS   Pain in the knee, especially with standing or squatting with the affected leg.  Tenderness along the joint  line.  Swelling in the knee joint (effusion), usually starting 1 to 2 days after injury.  Locking or catching of the knee joint, causing inability to straighten the knee completely.  Giving way or buckling of the knee. CAUSES  A meniscus tear occurs when a force is placed on the meniscus that is greater than it can handle. Common causes of injury include:  Direct hit (trauma) to the knee.  Twisting, pivoting, or cutting (rapidly changing direction while running), kneeling or squatting.  Without injury, due to aging. RISK INCREASES WITH:  Contact sports (football, rugby).  Sports in which cleats are used with pivoting (soccer, lacrosse) or sports in which good shoe grip and sudden change in direction are required (racquetball, basketball, squash).  Previous knee injury.  Associated knee injury, particularly ligament injuries.  Poor strength and flexibility. PREVENTION  Warm up and stretch properly before activity.  Maintain physical fitness:  Strength, flexibility, and endurance.  Cardiovascular fitness.  Protect the knee with a brace or elastic bandage.  Wear properly fitted protective equipment (proper cleats for the surface). PROGNOSIS  Sometimes, meniscus tears heal on their own. However, definitive treatment requires surgery, followed by at least 6 weeks of recovery.  RELATED COMPLICATIONS   Recurring symptoms that result in a chronic problem.  Repeated knee injury, especially if sports are resumed too soon after injury or surgery.  Progression of the tear (the tear gets larger), if untreated.  Arthritis of the knee in later years (with or without surgery).  Complications of surgery, including infection, bleeding, injury to nerves (numbness, weakness, paralysis) continued pain, giving way, locking, nonhealing of meniscus (if repaired), need for further surgery, and knee stiffness (loss of motion). TREATMENT  Treatment first involves the use of ice and  medicine, to reduce pain and inflammation. You may find using crutches to walk more comfortable. However, it is okay to bear weight on the injured knee, if the pain will allow it. Surgery is often advised as a definitive treatment. Surgery is performed through an incision near the joint (arthroscopically). The torn piece of the meniscus is removed, and if possible the joint cartilage is repaired. After surgery, the joint must be restrained. After restraint, it is important to perform strengthening and stretching exercises to help regain strength and a full range of motion. These exercises may be completed at home or with a therapist.  MEDICATION  If pain medicine is needed, nonsteroidal anti-inflammatory medicines (aspirin and ibuprofen), or other minor pain relievers (acetaminophen), are often advised.  Do not take pain medicine for 7 days before surgery.  Prescription pain relievers may be given, if your caregiver thinks they are needed. Use only as directed and only as much as you need. HEAT AND COLD  Cold treatment (icing) should be applied for 10 to 15 minutes every 2 to 3 hours for inflammation and pain, and immediately after activity that aggravates your symptoms. Use ice packs or an ice massage.  Heat treatment may be used before performing stretching and strengthening activities prescribed by your caregiver, physical therapist, or athletic trainer. Use a heat pack or a warm water soak. [place]  IF:   Symptoms get worse or do not improve in 2 weeks, despite treatment.  New, unexplained symptoms develop. (Drugs used in treatment may produce side effects.) EXERCISES RANGE OF MOTION (ROM) AND STRETCHING EXERCISES - Meniscus Tear, Non-operative, Phase I These are some of the initial exercises with which you may start your rehabilitation program, until you see your caregiver again or until your symptoms are resolved. Remember:   These initial exercises are intended to be gentle.  They will help you restore motion without increasing any swelling.  Completing these exercises allows less painful movement and prepares you for the more aggressive strengthening exercises in Phase II.  An effective stretch should be held for at least 30 seconds.  A stretch should never be painful. You should only feel a gentle lengthening or release in the stretched tissue. RANGE OF MOTION - Knee Flexion, Active  Lie on your back with both knees straight. (If this causes back discomfort, bend your healthy knee, placing your foot flat on the floor.)  Slowly slide your heel back toward your buttocks until you feel a gentle stretch in the front of your knee or thigh.  Hold for __________ seconds. Slowly slide your heel back to the starting position. Repeat __________ times. Complete this exercise __________ times per day.  RANGE OF MOTION - Knee Flexion and Extension, Active-Assisted  Sit on the edge of a table or chair with your thighs firmly supported. It may be helpful to place a folded towel under the end of your right / left thigh.  Flexion (bending): Place the ankle of your healthy leg on top of the other ankle. Use your healthy leg to gently bend your right / left knee until you feel a mild tension across the top of your knee.  Hold for __________ seconds.  Extension (straightening): Switch your ankles so your right / left leg is on top. Use your healthy leg to straighten your right / left knee until you feel a mild tension on the backside of your knee.  Hold for __________ seconds. Repeat __________ times. Complete __________ times per day. STRETCH - Knee Flexion, Supine  Lie on the floor with your right / left heel and foot lightly touching the wall. (Place both feet on the wall if you do not use a door frame.)  Without using any effort, allow gravity to slide your foot down the wall slowly until you feel a gentle stretch in the front of your right / left knee.  Hold this  stretch for __________ seconds. Then return the leg to the starting position, using your healthy leg for help, if needed. Repeat __________ times. Complete this stretch __________ times per day.  STRETCH - Knee Extension Sitting  Sit with your right / left leg/heel propped on another chair, coffee table, or foot stool.  Allow your leg muscles to relax, letting gravity straighten out your knee.*  You should feel a stretch behind your right / left knee. Hold this position for __________ seconds. Repeat __________ times. Complete this stretch __________ times per day.  *Your physician, physical therapist or athletic trainer may instruct you place a __________ weight on your thigh, just above your kneecap, to deepen the stretch.  STRENGTHENING EXERCISES - Meniscus Tear, Non-operative, Phase I These exercises may help you when beginning to rehabilitate your injury. They may resolve your symptoms with or without further involvement from your physician, physical therapist or athletic trainer. While completing these exercises, remember:   Muscles can gain both  the endurance and the strength needed for everyday activities through controlled exercises.  Complete these exercises as instructed by your physician, physical therapist or athletic trainer. Progress the resistance and repetitions only as guided. STRENGTH - Quadriceps, Isometrics  Lie on your back with your right / left leg extended and your opposite knee bent.  Gradually tense the muscles in the front of your right / left thigh. You should see either your knee cap slide up toward your hip or increased dimpling just above the knee. This motion will push the back of the knee down toward the floor, mat, or bed on which you are lying.  Hold the muscle as tight as you can, without increasing your pain, for __________ seconds.  Relax the muscles slowly and completely between each repetition. Repeat __________ times. Complete this exercise  __________ times per day.  STRENGTH - Quadriceps, Short Arcs   Lie on your back. Place a __________ inch towel roll under your right / left knee, so that the knee bends slightly.  Raise only your lower leg by tightening the muscles in the front of your thigh. Do not allow your thigh to rise.  Hold this position for __________ seconds. Repeat __________ times. Complete this exercise __________ times per day.  OPTIONAL ANKLE WEIGHTS: Begin with ____________________, but DO NOT exceed ____________________. Increase in 1 pound/0.5 kilogram increments. STRENGTH - Quadriceps, Straight Leg Raises  Quality counts! Watch for signs that the quadriceps muscle is working, to be sure you are strengthening the correct muscles and not "cheating" by substituting with healthier muscles.  Lay on your back with your right / left leg extended and your opposite knee bent.  Tense the muscles in the front of your right / left thigh. You should see either your knee cap slide up or increased dimpling just above the knee. Your thigh may even shake a bit.  Tighten these muscles even more and raise your leg 4 to 6 inches off the floor. Hold for __________ seconds.  Keeping these muscles tense, lower your leg.  Relax the muscles slowly and completely in between each repetition. Repeat __________ times. Complete this exercise __________ times per day.  STRENGTH - Hamstring, Curls   Lay on your stomach with your legs extended. (If you lay on a bed, your feet may hang over the edge.)  Tighten the muscles in the back of your thigh to bend your right / left knee up to 90 degrees. Keep your hips flat on the bed.  Hold this position for __________ seconds.  Slowly lower your leg back to the starting position. Repeat __________ times. Complete this exercise __________ times per day.  STRENGTH - Quadriceps, Squats  Stand in a door frame so that your feet and knees are in line with the frame.  Use your hands for  balance, not support, on the frame.  Slowly lower your weight, bending at the hips and knees. Keep your lower legs upright so that they are parallel with the door frame. Squat only within the range that does not increase your knee pain. Never let your hips drop below your knees.  Slowly return upright, pushing with your legs, not pulling with your hands. Repeat __________ times. Complete this exercise __________ times per day.  STRENGTH - Quad/VMO, Isometric   Sit in a chair with your right / left knee slightly bent. With your fingertips, feel the VMO muscle just above the inside of your knee. The VMO is important in controlling the position of  your kneecap.  Keeping your fingertips on this muscle. Without actually moving your leg, attempt to drive your knee down as if straightening your leg. You should feel your VMO tense. If you have a difficult time, you may wish to try the same exercise on your healthy knee first.  Tense this muscle as hard as you can without increasing any knee pain.  Hold for __________ seconds. Relax the muscles slowly and completely in between each repetition. Repeat __________ times. Complete exercise __________ times per day.  Document Released: 11/29/1998 Document Revised: 02/07/2012 Document Reviewed: 02/27/2009 [facility] Patient Information 2015 [place], [place]. This information is not intended to replace advice given to you by your health care provider. Make sure you discuss any questions you have with your health care provider.

## 2014-06-18 NOTE — Progress Notes (Signed)
Subjective:    Patient ID: Andrew Blake, male    DOB: 05-Jun-1961, 53 y.o.   MRN: [id number]  HPI Patient presents to the clinic for complete physical.  He recently played a game of cough and his right knee has been hurting since. Seems to be located more over the medial joint space. He has done nothing to make better. Twisting motions and getting up and down seems to make worse. He denies any falls. He is not taking any NSAIDs  .Marland Kitchen Active Ambulatory Problems    Diagnosis Date Noted  . ANXIETY 05/20/2010  . ALLERGIC RHINITIS 05/20/2010  . SHOULDER PAIN, RIGHT, CHRONIC 08/18/2009  . ADHESIVE CAPSULITIS, RIGHT 08/18/2009  . Plantar fasciitis 01/19/2012  . Abnormality of gait 01/19/2012  . Depression 04/17/2012  . Hypothyroidism 04/21/2012  . Hypothyroid    Resolved Ambulatory Problems    Diagnosis Date Noted  . POISON IVY DERMATITIS 05/20/2010   Past Medical History  Diagnosis Date  . Anxiety   . Allergy   . Adenomatous polyp   . Adhesive capsulitis of right shoulder    .Marland Kitchen Family History  Problem Relation Age of Onset  . Hypertension Father   . Depression Father   . Depression Brother   . Depression Brother   . Heart attack Paternal Grandmother   . Stroke Paternal Grandfather    .Marland Kitchen History   Social History  . Marital Status: Married    Spouse Name: N/A    Number of Children: N/A  . Years of Education: N/A   Occupational History  . Not on file.   Social History Main Topics  . Smoking status: Former Smoker -- 1.00 packs/day for 10 years    Types: Cigarettes    Quit date: 02/27/1990  . Smokeless tobacco: Never Used  . Alcohol Use: 7.2 oz/week    12 Cans of beer per week  . Drug Use: No  . Sexual Activity: Not on file   Other Topics Concern  . Not on file   Social History Narrative  . No narrative on file      Review of Systems  All other systems reviewed and are negative.      Objective:   Physical Exam BP 134/81  Pulse 73  Ht 5\' 9"   (1.753 m)  Wt 183 lb (83.008 kg)  BMI 27.01 kg/m2  General Appearance:    Alert, cooperative, no distress, appears stated age  Head:    Normocephalic, without obvious abnormality, atraumatic  Eyes:    PERRL, conjunctiva/corneas clear, EOM's intact, fundi    benign, both eyes       Ears:    Normal TM's and external ear canals, both ears  Nose:   Nares normal, septum midline, mucosa normal, no drainage    or sinus tenderness  Throat:   Lips, mucosa, and tongue normal; teeth and gums normal  Neck:   Supple, symmetrical, trachea midline, no adenopathy;       thyroid:  No enlargement/tenderness/nodules; no carotid   bruit or JVD  Back:     Symmetric, no curvature, ROM normal, no CVA tenderness  Lungs:     Clear to auscultation bilaterally, respirations unlabored  Chest wall:    No tenderness or deformity  Heart:    Regular rate and rhythm, S1 and S2 normal, no murmur, rub   or gallop  Abdomen:     Soft, non-tender, bowel sounds active all four quadrants,    no masses, no organomegaly  Rectal:    Normal tone, normal prostate, no masses or tenderness;   guaiac negative stool  Extremities:   Extremities normal, atraumatic, no cyanosis or edema. Right medial knee tender to palpation over joint space. No laxity of right knee. Normal extension/flexion.   Pulses:   2+ and symmetric all extremities  Skin:   Skin color, texture, turgor normal, no rashes or lesions  Lymph nodes:   Cervical, supraclavicular, and axillary nodes normal  Neurologic:   CNII-XII intact. Normal strength, sensation and reflexes      throughout         Assessment & Plan:  CPE- colonoscopy, tdap, shingles up to date. Labs were drawn and perfect.  AUA was 2 and denied any urinary symptoms. PSA was normal via labs. Continue to exercise regularly at least 150 minutes a week and work on some mild weight loss.  Right knee pain- discussed could have sprained the knee vs slight meniscal tear. Take ibuprofen/aleve regularly  for next 2 weeks. Given HO with exercises to do. If continues follow up with Dr. Darene Lamer sports medicine other conservative therapy might be warranted or imaging of knee.    Hypothyroidism- refilled for 6 months.  Anxiety/depression-very controlled refilled Zoloft for 6 months.

## 2014-07-11 ENCOUNTER — Other Ambulatory Visit: Payer: Self-pay | Admitting: Physician Assistant

## 2014-08-14 ENCOUNTER — Encounter: Payer: Self-pay | Admitting: Gastroenterology

## 2014-11-25 ENCOUNTER — Other Ambulatory Visit: Payer: Self-pay | Admitting: Physician Assistant

## 2014-12-25 ENCOUNTER — Other Ambulatory Visit: Payer: Self-pay | Admitting: Physician Assistant

## 2015-04-21 ENCOUNTER — Encounter: Payer: Self-pay | Admitting: Gastroenterology

## 2015-05-22 LAB — LIPID PANEL
Cholesterol: 155 mg/dL (ref 0–200)
HDL: 32 mg/dL — AB (ref 35–70)
LDL Cholesterol: 102 mg/dL
Triglycerides: 104 mg/dL (ref 40–160)

## 2015-05-22 LAB — BASIC METABOLIC PANEL: Glucose: 97 mg/dL

## 2015-05-22 LAB — HEMOGLOBIN A1C: Hgb A1c MFr Bld: 5.6 % (ref 4.0–6.0)

## 2015-06-10 ENCOUNTER — Encounter: Payer: Self-pay | Admitting: Family Medicine

## 2015-06-11 ENCOUNTER — Encounter: Payer: Self-pay | Admitting: Family Medicine

## 2015-06-11 ENCOUNTER — Ambulatory Visit (INDEPENDENT_AMBULATORY_CARE_PROVIDER_SITE_OTHER): Payer: BLUE CROSS/BLUE SHIELD | Admitting: Family Medicine

## 2015-06-11 VITALS — BP 140/80 | HR 92 | Ht 69.0 in | Wt 182.0 lb

## 2015-06-11 DIAGNOSIS — E039 Hypothyroidism, unspecified: Secondary | ICD-10-CM | POA: Diagnosis not present

## 2015-06-11 DIAGNOSIS — Z0000 Encounter for general adult medical examination without abnormal findings: Secondary | ICD-10-CM

## 2015-06-11 NOTE — Assessment & Plan Note (Signed)
Patient is doing well. CMP, PSA, TSH and Free T4 pending. Will refill medications PRN.

## 2015-06-11 NOTE — Progress Notes (Signed)
Andrew Blake is a 54 y.o. male who presents to [facility]  today for well adult visit. Patient is feeling well with no complaints today. He has a PMH as below. His medicines are UTD. He notes that he had a lipid panel and A1c through a health fair at work at the end of June. His LDL was 102. His labs were otherwise normal. He notes that he gets a PSA yearly for prostate cancer screening. He has already had a colonoscopy. His vaccines are UTD. He exercises 3-4 times a week. No fever, chills, NVD.    Past Medical History  Diagnosis Date  . Anxiety   . Allergy   . Adenomatous polyp   . Adhesive capsulitis of right shoulder   . Depression   . Hypothyroid    Past Surgical History  Procedure Laterality Date  . Eye surgery      cataracts ou   History  Substance Use Topics  . Smoking status: Former Smoker -- 1.00 packs/day for 10 years    Types: Cigarettes    Quit date: 02/27/1990  . Smokeless tobacco: Never Used  . Alcohol Use: 7.2 oz/week    12 Cans of beer per week   ROS as above Medications: Current Outpatient Prescriptions  Medication Sig Dispense Refill  . aspirin 81 MG EC tablet Take 81 mg by mouth daily.      . fluticasone (FLONASE) 50 MCG/ACT nasal spray Place 2 sprays into both nostrils daily. 16 g 11  . fluticasone (FLONASE) 50 MCG/ACT nasal spray PLACE 2 SPRAYS INTO BOTH NOSTRILS DAILY. 16 g 6  . levothyroxine (SYNTHROID, LEVOTHROID) 137 MCG tablet TAKE 1 TABLET (137 MCG TOTAL) BY MOUTH DAILY BEFORE BREAKFAST. 90 tablet 1  . loratadine (CLARITIN) 10 MG tablet Take 10 mg by mouth daily.    . Multiple Vitamins-Minerals (MULTIVITAMIN PO) Take by mouth.    . sertraline (ZOLOFT) 100 MG tablet Take 2 tablets daily for Depression. 180 tablet 1   No current facility-administered medications for this visit.   Allergies  Allergen Reactions  . Penicillins     REACTION: Told as a child     Exam:  BP 140/80 mmHg  Pulse 92  Ht 5\' 9"  (1.753  m)  Wt 182 lb (82.555 kg)  BMI 26.86 kg/m2 Gen: Well NAD HEENT: EOMI,  MMM Lungs: Normal work of breathing. CTABL Heart: RRR no MRG Abd: NABS, Soft. Nondistended, Nontender Exts: Brisk capillary refill, warm and well perfused.   No results found for this or any previous visit (from the past 24 hour(s)). No results found.   Please see individual assessment and plan sections. This visit required moderate complexity and decision making.

## 2015-06-11 NOTE — Patient Instructions (Signed)
Thank you for coming in today. Return in 1 year or sooner if needed.   Call or go to the emergency room if you get worse, have trouble breathing, have chest pains, or palpitations.

## 2015-06-12 LAB — TSH: TSH: 5.535 u[IU]/mL — ABNORMAL HIGH (ref 0.350–4.500)

## 2015-06-12 LAB — COMPLETE METABOLIC PANEL WITH GFR
ALT: 17 U/L (ref 0–53)
AST: 15 U/L (ref 0–37)
Albumin: 4.3 g/dL (ref 3.5–5.2)
Alkaline Phosphatase: 73 U/L (ref 39–117)
BUN: 20 mg/dL (ref 6–23)
CO2: 22 mEq/L (ref 19–32)
Calcium: 9.5 mg/dL (ref 8.4–10.5)
Chloride: 108 mEq/L (ref 96–112)
Creat: 1.02 mg/dL (ref 0.50–1.35)
GFR, Est African American: >89 mL/min
GFR, Est Non African American: 84 mL/min
Glucose, Bld: 104 mg/dL — ABNORMAL HIGH (ref 70–99)
Potassium: 3.7 mEq/L (ref 3.5–5.3)
Sodium: 138 mEq/L (ref 135–145)
Total Bilirubin: 0.9 mg/dL (ref 0.2–1.2)
Total Protein: 7 g/dL (ref 6.0–8.3)

## 2015-06-12 LAB — T4, FREE: Free T4: 1.01 ng/dL (ref 0.80–1.80)

## 2015-06-12 LAB — PSA: PSA: 0.3 ng/mL (ref <=4.00)

## 2015-06-16 ENCOUNTER — Telehealth: Payer: Self-pay

## 2015-06-16 NOTE — Telephone Encounter (Signed)
Pt called requesting his lab results. I advised that you would result them and that i would give him a call back tomorrow. I see that the results are in so if will send me a not I will give him a call.

## 2015-06-17 NOTE — Telephone Encounter (Signed)
Pt.notified

## 2015-06-17 NOTE — Telephone Encounter (Signed)
Labs look ok  No changes

## 2015-06-24 ENCOUNTER — Encounter: Payer: Self-pay | Admitting: Physician Assistant

## 2015-06-24 ENCOUNTER — Telehealth: Payer: Self-pay

## 2015-06-24 NOTE — Telephone Encounter (Signed)
Pt called requesting a refill on levothyroxine and zoloft. Please confirm that it is ok for me to refill these meds. According to your last office note the levothyroxine should be fine to refill but I was not sure about the zoloft. Pt would likethese to go to [email or web address] home delivery. Please advise.

## 2015-06-25 MED ORDER — SERTRALINE HCL 100 MG PO TABS: ORAL_TABLET | ORAL | Status: DC

## 2015-06-25 MED ORDER — LEVOTHYROXINE SODIUM 137 MCG PO TABS: ORAL_TABLET | ORAL | Status: DC

## 2015-06-25 NOTE — Telephone Encounter (Signed)
Ok to refill levothyroxine and zoloft. 1 year for zoloft and 6 months for levothyroxine. Will need recheck TSH in 6 months

## 2015-06-25 NOTE — Telephone Encounter (Signed)
Left detailed VM informing pt that refills have been sent and when he should follow up.

## 2015-08-12 ENCOUNTER — Other Ambulatory Visit: Payer: Self-pay | Admitting: Physician Assistant

## 2015-11-25 ENCOUNTER — Telehealth: Payer: Self-pay | Admitting: *Deleted

## 2015-11-25 NOTE — Telephone Encounter (Signed)
Pt left vm stating that he needs a refill on his levothyroxine but wanted to make sure that he didn't need lab work checked first.  Dr. Georgina Snell checked them in July & said they were fine but since he's your pt I just wanted you to review them as well before I sent in refills.

## 2015-11-25 NOTE — Telephone Encounter (Signed)
Give him one month and yes needs TSH and free T4 rechecked.

## 2015-11-26 ENCOUNTER — Other Ambulatory Visit: Payer: Self-pay | Admitting: *Deleted

## 2015-11-26 MED ORDER — LEVOTHYROXINE SODIUM 137 MCG PO TABS: ORAL_TABLET | ORAL | Status: DC

## 2015-11-26 NOTE — Telephone Encounter (Signed)
Refill sent.  Pt notified to have labs checked before rx runs out.

## 2015-12-02 ENCOUNTER — Other Ambulatory Visit: Payer: Self-pay | Admitting: Physician Assistant

## 2016-03-17 ENCOUNTER — Telehealth: Payer: Self-pay | Admitting: *Deleted

## 2016-03-17 DIAGNOSIS — E038 Other specified hypothyroidism: Secondary | ICD-10-CM

## 2016-03-17 NOTE — Telephone Encounter (Signed)
Labs ordered.

## 2016-03-23 LAB — TSH: TSH: 20.11 mIU/L — ABNORMAL HIGH (ref 0.40–4.50)

## 2016-03-23 LAB — T4, FREE: Free T4: 1.1 ng/dL (ref 0.8–1.8)

## 2016-03-23 LAB — T3, FREE: T3, Free: 2.6 pg/mL (ref 2.3–4.2)

## 2016-03-24 MED ORDER — LEVOTHYROXINE SODIUM 150 MCG PO TABS: 150.0000 ug | ORAL_TABLET | Freq: Every day | ORAL | Status: DC

## 2016-03-24 NOTE — Addendum Note (Signed)
Addended by: Donella Stade on: 03/24/2016 04:41 PM   Modules accepted: Orders

## 2016-03-24 NOTE — Telephone Encounter (Signed)
Will increase

## 2016-03-24 NOTE — Telephone Encounter (Signed)
Call pt: will increase to 172mcg daily. Recheck in 3 months.

## 2016-03-25 NOTE — Telephone Encounter (Signed)
LMOM notifying pt of rx increase & to recheck labs in 3 months.

## 2016-05-13 LAB — LIPID PANEL
Cholesterol: 166 mg/dL (ref 0–200)
HDL: 34 mg/dL — AB (ref 35–70)
LDL Cholesterol: 95 mg/dL
Triglycerides: 185 mg/dL — AB (ref 40–160)

## 2016-05-13 LAB — HEMOGLOBIN A1C: Hemoglobin A1C: 5.8

## 2016-05-13 LAB — BASIC METABOLIC PANEL: Glucose: 95 mg/dL

## 2016-06-03 ENCOUNTER — Telehealth: Payer: Self-pay | Admitting: *Deleted

## 2016-06-03 DIAGNOSIS — Z0000 Encounter for general adult medical examination without abnormal findings: Secondary | ICD-10-CM

## 2016-06-03 DIAGNOSIS — Z13220 Encounter for screening for lipoid disorders: Secondary | ICD-10-CM

## 2016-06-03 DIAGNOSIS — E039 Hypothyroidism, unspecified: Secondary | ICD-10-CM

## 2016-06-03 NOTE — Telephone Encounter (Signed)
Pt left vm wanting to know when he was due to have his TSH checked.  Last results were end of April & there's no suggestions on recheck.  Please advise.

## 2016-06-04 NOTE — Telephone Encounter (Signed)
He is due for cpe and all labs.  Lipid, psa, cbc, cmp, TSH, free t4 Get these and schedule a cpe.

## 2016-06-04 NOTE — Telephone Encounter (Signed)
Labs ordered.  LMOM notifying pt. 

## 2016-06-12 LAB — CBC
HCT: 45.8 % (ref 38.5–50.0)
Hemoglobin: 15.7 g/dL (ref 13.2–17.1)
MCH: 30.9 pg (ref 27.0–33.0)
MCHC: 34.3 g/dL (ref 32.0–36.0)
MCV: 90.2 fL (ref 80.0–100.0)
MPV: 10.5 fL (ref 7.5–12.5)
Platelets: 219 10*3/uL (ref 140–400)
RBC: 5.08 MIL/uL (ref 4.20–5.80)
RDW: 12.7 % (ref 11.0–15.0)
WBC: 9.2 10*3/uL (ref 3.8–10.8)

## 2016-06-12 LAB — T4, FREE: Free T4: 1.2 ng/dL (ref 0.8–1.8)

## 2016-06-12 LAB — LIPID PANEL
Cholesterol: 161 mg/dL (ref 125–200)
HDL: 42 mg/dL (ref >=40)
LDL Cholesterol: 102 mg/dL (ref <130)
Total CHOL/HDL Ratio: 3.8 Ratio (ref <=5.0)
Triglycerides: 83 mg/dL (ref <150)
VLDL: 17 mg/dL (ref <30)

## 2016-06-12 LAB — COMPLETE METABOLIC PANEL WITH GFR
ALT: 15 U/L (ref 9–46)
AST: 17 U/L (ref 10–35)
Albumin: 4.2 g/dL (ref 3.6–5.1)
Alkaline Phosphatase: 66 U/L (ref 40–115)
BUN: 25 mg/dL (ref 7–25)
CO2: 22 mmol/L (ref 20–31)
Calcium: 9.5 mg/dL (ref 8.6–10.3)
Chloride: 104 mmol/L (ref 98–110)
Creat: 0.97 mg/dL (ref 0.70–1.33)
GFR, Est African American: >89 mL/min (ref >=60)
GFR, Est Non African American: 88 mL/min (ref >=60)
Glucose, Bld: 91 mg/dL (ref 65–99)
Potassium: 4 mmol/L (ref 3.5–5.3)
Sodium: 141 mmol/L (ref 135–146)
Total Bilirubin: 0.9 mg/dL (ref 0.2–1.2)
Total Protein: 7.3 g/dL (ref 6.1–8.1)

## 2016-06-12 LAB — PSA: PSA: 0.27 ng/mL (ref <=4.00)

## 2016-06-12 LAB — TSH: TSH: 8.92 mIU/L — ABNORMAL HIGH (ref 0.40–4.50)

## 2016-06-17 ENCOUNTER — Ambulatory Visit (INDEPENDENT_AMBULATORY_CARE_PROVIDER_SITE_OTHER): Payer: 59 | Admitting: Physician Assistant

## 2016-06-17 ENCOUNTER — Encounter: Payer: Self-pay | Admitting: Physician Assistant

## 2016-06-17 VITALS — BP 132/71 | HR 69 | Ht 69.0 in | Wt 173.0 lb

## 2016-06-17 DIAGNOSIS — Z23 Encounter for immunization: Secondary | ICD-10-CM

## 2016-06-17 DIAGNOSIS — Z1211 Encounter for screening for malignant neoplasm of colon: Secondary | ICD-10-CM

## 2016-06-17 DIAGNOSIS — F329 Major depressive disorder, single episode, unspecified: Secondary | ICD-10-CM

## 2016-06-17 DIAGNOSIS — F32A Depression, unspecified: Secondary | ICD-10-CM

## 2016-06-17 DIAGNOSIS — Z0000 Encounter for general adult medical examination without abnormal findings: Secondary | ICD-10-CM | POA: Diagnosis not present

## 2016-06-17 DIAGNOSIS — E039 Hypothyroidism, unspecified: Secondary | ICD-10-CM

## 2016-06-17 DIAGNOSIS — M25561 Pain in right knee: Secondary | ICD-10-CM | POA: Diagnosis not present

## 2016-06-17 MED ORDER — SERTRALINE HCL 100 MG PO TABS: ORAL_TABLET | ORAL | Status: DC

## 2016-06-17 MED ORDER — FLUTICASONE PROPIONATE 50 MCG/ACT NA SUSP: NASAL | Status: DC

## 2016-06-17 MED ORDER — LEVOTHYROXINE SODIUM 150 MCG PO TABS: 150.0000 ug | ORAL_TABLET | Freq: Every day | ORAL | Status: DC

## 2016-06-17 NOTE — Patient Instructions (Signed)
Glucosamine chondritin Aleve as needed.  Tumeric 500mg  twice a day.   Osteoarthritis Osteoarthritis is a disease that causes soreness and inflammation of a joint. It occurs when the cartilage at the affected joint wears down. Cartilage acts as a cushion, covering the ends of bones where they meet to form a joint. Osteoarthritis is the most common form of arthritis. It often occurs in older people. The joints affected most often by this condition include those in the:  Ends of the fingers.  Thumbs.  Neck.  Lower back.  Knees.  Hips. CAUSES  Over time, the cartilage that covers the ends of bones begins to wear away. This causes bone to rub on bone, producing pain and stiffness in the affected joints.  RISK FACTORS Certain factors can increase your chances of having osteoarthritis, including:  Older age.  Excessive body weight.  Overuse of joints.  Previous joint injury. SIGNS AND SYMPTOMS   Pain, swelling, and stiffness in the joint.  Over time, the joint may lose its normal shape.  Small deposits of bone (osteophytes) may grow on the edges of the joint.  Bits of bone or cartilage can break off and float inside the joint space. This may cause more pain and damage. DIAGNOSIS  Your health care provider will do a physical exam and ask about your symptoms. Various tests may be ordered, such as:  X-rays of the affected joint.  Blood tests to rule out other types of arthritis. Additional tests may be used to diagnose your condition. TREATMENT  Goals of treatment are to control pain and improve joint function. Treatment plans may include:  A prescribed exercise program that allows for rest and joint relief.  A weight control plan.  Pain relief techniques, such as:  Properly applied heat and cold.  Electric pulses delivered to nerve endings under the skin (transcutaneous electrical nerve stimulation [TENS]).  Massage.  Certain nutritional supplements.  Medicines  to control pain, such as:  Acetaminophen.  Nonsteroidal anti-inflammatory drugs (NSAIDs), such as naproxen.  Narcotic or central-acting agents, such as tramadol.  Corticosteroids. These can be given orally or as an injection.  Surgery to reposition the bones and relieve pain (osteotomy) or to remove loose pieces of bone and cartilage. Joint replacement may be needed in advanced states of osteoarthritis. HOME CARE INSTRUCTIONS   Take medicines only as directed by your health care provider.  Maintain a healthy weight. Follow your health care provider's instructions for weight control. This may include dietary instructions.  Exercise as directed. Your health care provider can recommend specific types of exercise. These may include:  Strengthening exercises. These are done to strengthen the muscles that support joints affected by arthritis. They can be performed with weights or with exercise bands to add resistance.  Aerobic activities. These are exercises, such as brisk walking or low-impact aerobics, that get your heart pumping.  Range-of-motion activities. These keep your joints limber.  Balance and agility exercises. These help you maintain daily living skills.  Rest your affected joints as directed by your health care provider.  Keep all follow-up visits as directed by your health care provider. SEEK MEDICAL CARE IF:   Your skin turns red.  You develop a rash in addition to your joint pain.  You have worsening joint pain.  You have a fever along with joint or muscle aches. SEEK IMMEDIATE MEDICAL CARE IF:  You have a significant loss of weight or appetite.  You have night sweats. FOR MORE INFORMATION   National  Institute of Arthritis and Musculoskeletal and Skin Diseases: www.niams.SouthExposed.es  [employer] on Aging: http://kim-miller.com/  American College of Rheumatology: www.rheumatology.org   This information is not intended to replace advice given to you by your  health care provider. Make sure you discuss any questions you have with your health care provider.   Document Released: 11/15/2005 Document Revised: 12/06/2014 Document Reviewed: 07/23/2013 Elsevier Interactive Patient Education [employer].

## 2016-06-18 ENCOUNTER — Encounter: Payer: Self-pay | Admitting: Physician Assistant

## 2016-06-18 NOTE — Progress Notes (Signed)
Subjective:    Patient ID: Andrew Blake, male    DOB: 03-May-1961, 55 y.o.   MRN: [id number]  HPI Pt is a 55 yo male who presents to the clinic for CPE.   .. Active Ambulatory Problems    Diagnosis Date Noted  . ANXIETY 05/20/2010  . ALLERGIC RHINITIS 05/20/2010  . SHOULDER PAIN, RIGHT, CHRONIC 08/18/2009  . ADHESIVE CAPSULITIS, RIGHT 08/18/2009  . Plantar fasciitis 01/19/2012  . Abnormality of gait 01/19/2012  . Depression 04/17/2012  . Hypothyroidism 04/21/2012  . Well adult health check 06/11/2015   Resolved Ambulatory Problems    Diagnosis Date Noted  . POISON IVY DERMATITIS 05/20/2010  . Hypothyroid    Past Medical History  Diagnosis Date  . Anxiety   . Allergy   . Adenomatous polyp   . Adhesive capsulitis of right shoulder    .Marland Kitchen Family History  Problem Relation Age of Onset  . Hypertension Father   . Depression Father   . Depression Brother   . Depression Brother   . Heart attack Paternal Grandmother   . Stroke Paternal Grandfather    .Marland Kitchen Social History   Social History  . Marital Status: Married    Spouse Name: N/A  . Number of Children: N/A  . Years of Education: N/A   Occupational History  . Not on file.   Social History Main Topics  . Smoking status: Former Smoker -- 1.00 packs/day for 10 years    Types: Cigarettes    Quit date: 02/27/1990  . Smokeless tobacco: Never Used  . Alcohol Use: 7.2 oz/week    12 Cans of beer per week  . Drug Use: No  . Sexual Activity: Not on file   Other Topics Concern  . Not on file   Social History Narrative   Pt is doing great on zoloft. Since increase of synthroid he does feel more anxious.    Review of Systems  All other systems reviewed and are negative.      Objective:   Physical Exam BP 132/71 mmHg  Pulse 69  Ht 5\' 9"  (1.753 m)  Wt 173 lb (78.472 kg)  BMI 25.54 kg/m2  General Appearance:    Alert, cooperative, no distress, appears stated age  Head:    Normocephalic, without obvious  abnormality, atraumatic  Eyes:    PERRL, conjunctiva/corneas clear, EOM's intact, fundi    benign, both eyes       Ears:    Normal TM's and external ear canals, both ears  Nose:   Nares normal, septum midline, mucosa normal, no drainage    or sinus tenderness  Throat:   Lips, mucosa, and tongue normal; teeth and gums normal  Neck:   Supple, symmetrical, trachea midline, no adenopathy;       thyroid:  No enlargement/tenderness/nodules; no carotid   bruit or JVD  Back:     Symmetric, no curvature, ROM normal, no CVA tenderness  Lungs:     Clear to auscultation bilaterally, respirations unlabored  Chest wall:    No tenderness or deformity  Heart:    Regular rate and rhythm, S1 and S2 normal, no murmur, rub   or gallop  Abdomen:     Soft, non-tender, bowel sounds active all four quadrants,    no masses, no organomegaly  Genitalia:  Not done.   Rectal:  Not done.   Extremities:   Extremities normal, atraumatic, no cyanosis or edema  Pulses:   2+ and symmetric all extremities  Skin:  Skin color, texture, turgor normal, no rashes or lesions  Lymph nodes:   Cervical, supraclavicular, and axillary nodes normal  Neurologic:   CNII-XII intact. Normal strength, sensation and reflexes      throughout         Assessment & Plan:  CPE- on ASA. Labs discussed and look good. Tdap given today. Refer for colonoscopy.   Depression- refilled zoloft for 6 months.   Hypothyroidism- TSH is still elevated but improved from last check. After increasing synthroid he felt more anxious and does not want to increase medication any more. Will recheck in 3 months and see how improving.

## 2016-06-24 ENCOUNTER — Ambulatory Visit (INDEPENDENT_AMBULATORY_CARE_PROVIDER_SITE_OTHER): Payer: 59

## 2016-06-24 ENCOUNTER — Telehealth: Payer: Self-pay | Admitting: *Deleted

## 2016-06-24 DIAGNOSIS — S63281A Dislocation of proximal interphalangeal joint of left index finger, initial encounter: Secondary | ICD-10-CM | POA: Diagnosis not present

## 2016-06-24 DIAGNOSIS — M25542 Pain in joints of left hand: Secondary | ICD-10-CM

## 2016-06-24 DIAGNOSIS — M25562 Pain in left knee: Secondary | ICD-10-CM | POA: Diagnosis not present

## 2016-06-24 DIAGNOSIS — M25461 Effusion, right knee: Secondary | ICD-10-CM

## 2016-06-24 DIAGNOSIS — X58XXXA Exposure to other specified factors, initial encounter: Secondary | ICD-10-CM | POA: Diagnosis not present

## 2016-06-24 DIAGNOSIS — M25561 Pain in right knee: Secondary | ICD-10-CM

## 2016-06-24 NOTE — Telephone Encounter (Signed)
X-ray of left index finger ordered.

## 2016-06-28 ENCOUNTER — Encounter: Payer: Self-pay | Admitting: Physician Assistant

## 2016-06-28 DIAGNOSIS — S62621D Displaced fracture of medial phalanx of left index finger, subsequent encounter for fracture with routine healing: Secondary | ICD-10-CM | POA: Insufficient documentation

## 2016-06-28 DIAGNOSIS — M1711 Unilateral primary osteoarthritis, right knee: Secondary | ICD-10-CM | POA: Insufficient documentation

## 2016-07-06 ENCOUNTER — Ambulatory Visit (INDEPENDENT_AMBULATORY_CARE_PROVIDER_SITE_OTHER): Payer: 59 | Admitting: Physician Assistant

## 2016-07-06 VITALS — BP 138/84 | HR 72

## 2016-07-06 DIAGNOSIS — S62621D Displaced fracture of medial phalanx of left index finger, subsequent encounter for fracture with routine healing: Secondary | ICD-10-CM | POA: Diagnosis not present

## 2016-07-06 NOTE — Progress Notes (Signed)
Patient came into clinic today to be fitted for a finger splint. Pt has a small avulsion fracture of index finger. Splint was applied and wrapped with Coban. Advised Pt to wear splint for 2 weeks and to keep it dry. If he is still having pain or swelling after that time period to contact office for further evaluation. Verbalized understanding, no further questions at this time.

## 2016-07-21 ENCOUNTER — Encounter: Payer: Self-pay | Admitting: Physician Assistant

## 2016-09-14 ENCOUNTER — Encounter: Payer: Self-pay | Admitting: Physician Assistant

## 2016-10-25 IMAGING — DX DG KNEE 1-2V*L*
2 series · 2 of 2 positions shown · non-contrast
Comparison: No recent prior.

CLINICAL DATA: Chronic knee pain.  No reported injury.

EXAM:
LEFT KNEE - 1-2 VIEW

[knee ap]
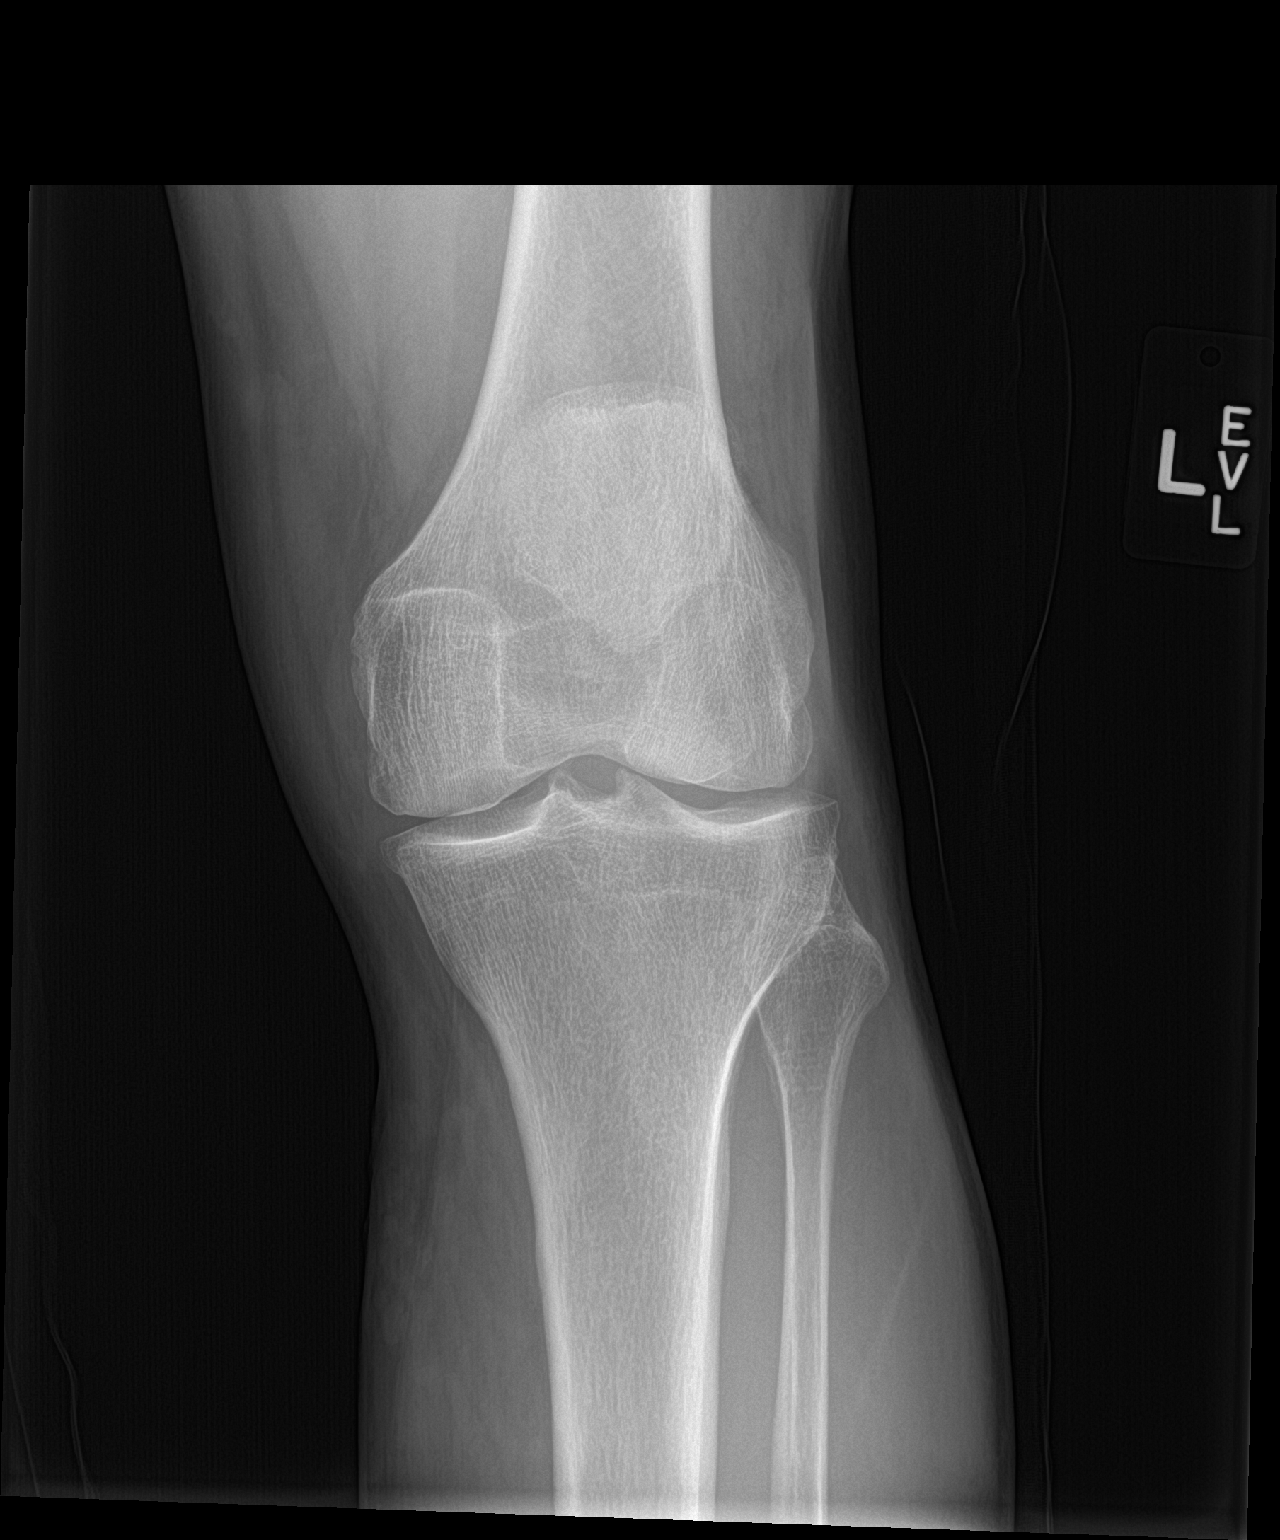

[knee lat]
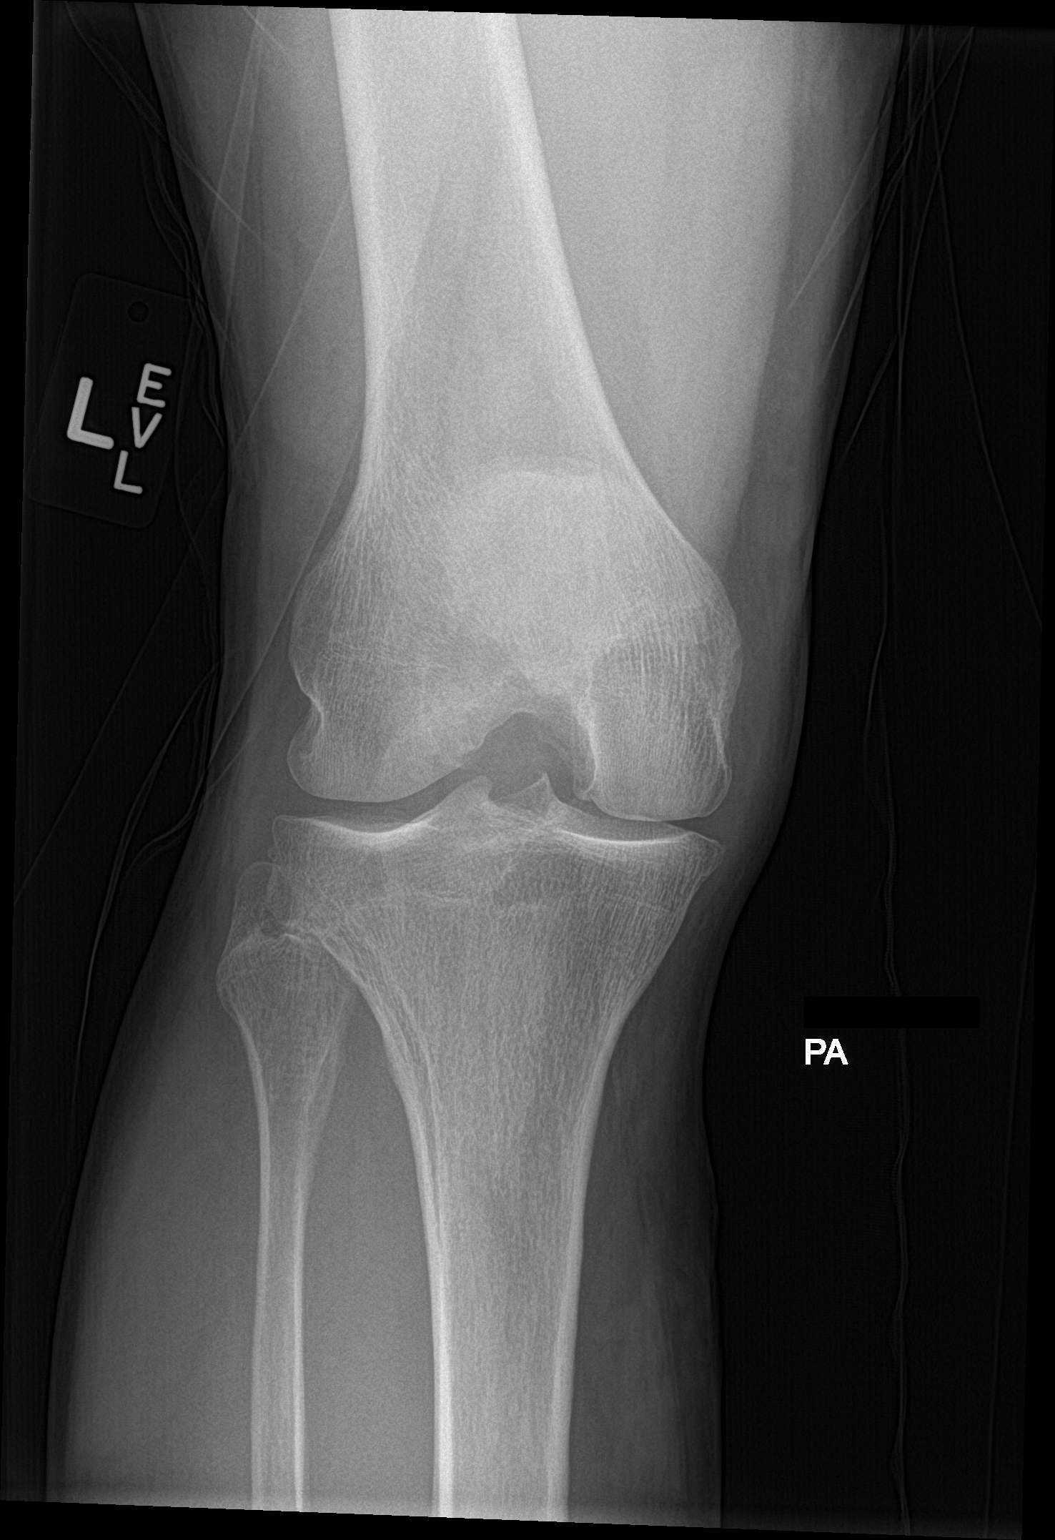

[2 of 2 positions shown; findings below may reference images not displayed]

FINDINGS: No acute bony or joint abnormality identified. No evidence of
fracture or dislocation.
IMPRESSION: No acute or focal abnormality.

## 2016-12-10 ENCOUNTER — Other Ambulatory Visit: Payer: Self-pay | Admitting: Physician Assistant

## 2017-01-04 ENCOUNTER — Telehealth: Payer: Self-pay | Admitting: Physician Assistant

## 2017-01-04 NOTE — Telephone Encounter (Signed)
Called patient lvm 01/04/17 to call back if received flu shot or decline

## 2017-01-15 ENCOUNTER — Other Ambulatory Visit: Payer: Self-pay | Admitting: Physician Assistant

## 2017-01-18 ENCOUNTER — Encounter: Payer: Self-pay | Admitting: Physician Assistant

## 2017-01-18 DIAGNOSIS — E039 Hypothyroidism, unspecified: Secondary | ICD-10-CM

## 2017-01-21 LAB — TSH: TSH: 11.27 mIU/L — ABNORMAL HIGH (ref 0.40–4.50)

## 2017-01-27 ENCOUNTER — Other Ambulatory Visit: Payer: Self-pay | Admitting: *Deleted

## 2017-01-27 MED ORDER — LEVOTHYROXINE SODIUM 175 MCG PO TABS: 175.0000 ug | ORAL_TABLET | Freq: Every day | ORAL | 1 refills | Status: DC

## 2017-02-04 ENCOUNTER — Other Ambulatory Visit: Payer: Self-pay | Admitting: Physician Assistant

## 2017-03-07 ENCOUNTER — Encounter: Payer: Self-pay | Admitting: Physician Assistant

## 2017-03-07 DIAGNOSIS — E039 Hypothyroidism, unspecified: Secondary | ICD-10-CM

## 2017-03-09 LAB — TSH: TSH: 3.12 mIU/L (ref 0.40–4.50)

## 2017-03-10 ENCOUNTER — Other Ambulatory Visit: Payer: Self-pay | Admitting: *Deleted

## 2017-03-10 MED ORDER — LEVOTHYROXINE SODIUM 175 MCG PO TABS: 175.0000 ug | ORAL_TABLET | Freq: Every day | ORAL | 1 refills | Status: DC

## 2017-03-22 ENCOUNTER — Other Ambulatory Visit: Payer: Self-pay | Admitting: Physician Assistant

## 2017-08-29 ENCOUNTER — Encounter: Payer: Self-pay | Admitting: Physician Assistant

## 2017-08-30 ENCOUNTER — Encounter: Payer: Self-pay | Admitting: Physician Assistant

## 2017-08-30 ENCOUNTER — Other Ambulatory Visit: Payer: Self-pay | Admitting: Physician Assistant

## 2017-08-30 DIAGNOSIS — E039 Hypothyroidism, unspecified: Secondary | ICD-10-CM

## 2017-08-30 DIAGNOSIS — Z0000 Encounter for general adult medical examination without abnormal findings: Secondary | ICD-10-CM

## 2017-08-30 DIAGNOSIS — Z125 Encounter for screening for malignant neoplasm of prostate: Secondary | ICD-10-CM

## 2017-08-30 DIAGNOSIS — Z13220 Encounter for screening for lipoid disorders: Secondary | ICD-10-CM

## 2017-08-30 MED ORDER — SERTRALINE HCL 100 MG PO TABS: ORAL_TABLET | ORAL | 0 refills | Status: DC

## 2017-08-30 MED ORDER — LEVOTHYROXINE SODIUM 175 MCG PO TABS: 175.0000 ug | ORAL_TABLET | Freq: Every day | ORAL | 0 refills | Status: DC

## 2017-08-30 NOTE — Progress Notes (Signed)
Labs ordered per Pt request.

## 2017-08-31 LAB — COMPLETE METABOLIC PANEL WITH GFR
AG Ratio: 1.7 (calc) (ref 1.0–2.5)
ALT: 14 U/L (ref 9–46)
AST: 15 U/L (ref 10–35)
Albumin: 4.4 g/dL (ref 3.6–5.1)
Alkaline phosphatase (APISO): 65 U/L (ref 40–115)
BUN: 18 mg/dL (ref 7–25)
CO2: 23 mmol/L (ref 20–32)
Calcium: 9.4 mg/dL (ref 8.6–10.3)
Chloride: 106 mmol/L (ref 98–110)
Creat: 0.98 mg/dL (ref 0.70–1.33)
GFR, Est African American: 99 mL/min/{1.73_m2} (ref >=60)
GFR, Est Non African American: 86 mL/min/{1.73_m2} (ref >=60)
Globulin: 2.6 g/dL (calc) (ref 1.9–3.7)
Glucose, Bld: 89 mg/dL (ref 65–139)
Potassium: 4 mmol/L (ref 3.5–5.3)
Sodium: 140 mmol/L (ref 135–146)
Total Bilirubin: 0.8 mg/dL (ref 0.2–1.2)
Total Protein: 7 g/dL (ref 6.1–8.1)

## 2017-08-31 LAB — CBC WITH DIFFERENTIAL/PLATELET
Basophils Absolute: 54 cells/uL (ref 0–200)
Basophils Relative: 0.6 %
Eosinophils Absolute: 207 cells/uL (ref 15–500)
Eosinophils Relative: 2.3 %
HCT: 44.3 % (ref 38.5–50.0)
Hemoglobin: 15.2 g/dL (ref 13.2–17.1)
Lymphs Abs: 2772 cells/uL (ref 850–3900)
MCH: 30.1 pg (ref 27.0–33.0)
MCHC: 34.3 g/dL (ref 32.0–36.0)
MCV: 87.7 fL (ref 80.0–100.0)
MPV: 10.3 fL (ref 7.5–12.5)
Monocytes Relative: 8.8 %
Neutro Abs: 5175 cells/uL (ref 1500–7800)
Neutrophils Relative %: 57.5 %
Platelets: 234 10*3/uL (ref 140–400)
RBC: 5.05 10*6/uL (ref 4.20–5.80)
RDW: 12.2 % (ref 11.0–15.0)
Total Lymphocyte: 30.8 %
WBC mixed population: 792 cells/uL (ref 200–950)
WBC: 9 10*3/uL (ref 3.8–10.8)

## 2017-08-31 LAB — LIPID PANEL
Cholesterol: 166 mg/dL (ref <200)
HDL: 37 mg/dL — ABNORMAL LOW (ref >40)
LDL Cholesterol (Calc): 108 mg/dL (calc) — ABNORMAL HIGH
Non-HDL Cholesterol (Calc): 129 mg/dL (calc) (ref <130)
Total CHOL/HDL Ratio: 4.5 (calc) (ref <5.0)
Triglycerides: 111 mg/dL (ref <150)

## 2017-08-31 LAB — PSA: PSA: 0.4 ng/mL (ref <=4.0)

## 2017-08-31 LAB — T4, FREE: Free T4: 1 ng/dL (ref 0.8–1.8)

## 2017-08-31 LAB — TSH: TSH: 7.65 mIU/L — ABNORMAL HIGH (ref 0.40–4.50)

## 2017-09-06 ENCOUNTER — Ambulatory Visit (INDEPENDENT_AMBULATORY_CARE_PROVIDER_SITE_OTHER): Payer: BLUE CROSS/BLUE SHIELD | Admitting: Physician Assistant

## 2017-09-06 ENCOUNTER — Encounter: Payer: Self-pay | Admitting: Physician Assistant

## 2017-09-06 VITALS — BP 131/63 | HR 74 | Ht 69.0 in | Wt 180.0 lb

## 2017-09-06 DIAGNOSIS — E039 Hypothyroidism, unspecified: Secondary | ICD-10-CM

## 2017-09-06 DIAGNOSIS — F3342 Major depressive disorder, recurrent, in full remission: Secondary | ICD-10-CM

## 2017-09-06 DIAGNOSIS — E7800 Pure hypercholesterolemia, unspecified: Secondary | ICD-10-CM | POA: Diagnosis not present

## 2017-09-06 DIAGNOSIS — Z23 Encounter for immunization: Secondary | ICD-10-CM

## 2017-09-06 DIAGNOSIS — D369 Benign neoplasm, unspecified site: Secondary | ICD-10-CM | POA: Diagnosis not present

## 2017-09-06 DIAGNOSIS — Z0000 Encounter for general adult medical examination without abnormal findings: Secondary | ICD-10-CM

## 2017-09-06 MED ORDER — LEVOTHYROXINE SODIUM 200 MCG PO TABS: 200.0000 ug | ORAL_TABLET | Freq: Every day | ORAL | 1 refills | Status: DC

## 2017-09-06 MED ORDER — SERTRALINE HCL 100 MG PO TABS: ORAL_TABLET | ORAL | 3 refills | Status: DC

## 2017-09-06 NOTE — Patient Instructions (Signed)

## 2017-09-06 NOTE — Progress Notes (Signed)
Subjective:    Patient ID: Andrew Blake, male    DOB: 07-14-1961, 56 y.o.   MRN: [id number]  HPI Pt is a 56 yo male who presents to the clinic for CPE. Pt has no concerns today.   .. Active Ambulatory Problems    Diagnosis Date Noted  . ANXIETY 05/20/2010  . ALLERGIC RHINITIS 05/20/2010  . SHOULDER PAIN, RIGHT, CHRONIC 08/18/2009  . ADHESIVE CAPSULITIS, RIGHT 08/18/2009  . Plantar fasciitis 01/19/2012  . Abnormality of gait 01/19/2012  . Depression 04/17/2012  . Hypothyroidism 04/21/2012  . Well adult health check 06/11/2015  . Osteoarthritis of right knee 06/28/2016  . Closed displaced fracture of middle phalanx of left index finger with routine healing 06/28/2016  . Elevated LDL cholesterol level 09/06/2017   Resolved Ambulatory Problems    Diagnosis Date Noted  . POISON IVY DERMATITIS 05/20/2010  . Hypothyroid    Past Medical History:  Diagnosis Date  . Adenomatous polyp   . Adhesive capsulitis of right shoulder   . Allergy   . Anxiety   . Depression   . Hypothyroid    .Marland Kitchen Family History  Problem Relation Age of Onset  . Hypertension Father   . Depression Father   . Depression Brother   . Depression Brother   . Heart attack Paternal Grandmother   . Stroke Paternal Grandfather    .Marland Kitchen Social History   Social History  . Marital status: Married    Spouse name: N/A  . Number of children: N/A  . Years of education: N/A   Occupational History  . Not on file.   Social History Main Topics  . Smoking status: Former Smoker    Packs/day: 1.00    Years: 10.00    Types: Cigarettes    Quit date: 02/27/1990  . Smokeless tobacco: Never Used  . Alcohol use 7.2 oz/week    12 Cans of beer per week  . Drug use: No  . Sexual activity: Not on file   Other Topics Concern  . Not on file   Social History Narrative  . No narrative on file      Review of Systems  All other systems reviewed and are negative.      Objective:   Physical Exam BP 131/63    Pulse 74   Ht 5\' 9"  (1.753 m)   Wt 180 lb (81.6 kg)   BMI 26.58 kg/m   General Appearance:    Alert, cooperative, no distress, appears stated age  Head:    Normocephalic, without obvious abnormality, atraumatic  Eyes:    PERRL, conjunctiva/corneas clear, EOM's intact, fundi    benign, both eyes       Ears:    Normal TM's and external ear canals, both ears  Nose:   Nares normal, septum midline, mucosa normal, no drainage    or sinus tenderness  Throat:   Lips, mucosa, and tongue normal; teeth and gums normal  Neck:   Supple, symmetrical, trachea midline, no adenopathy;       thyroid:  No enlargement/tenderness/nodules; no carotid   bruit or JVD  Back:     Symmetric, no curvature, ROM normal, no CVA tenderness  Lungs:     Clear to auscultation bilaterally, respirations unlabored  Chest wall:    No tenderness or deformity  Heart:    Regular rate and rhythm, S1 and S2 normal, no murmur, rub   or gallop  Abdomen:     Soft, non-tender, bowel sounds active all four quadrants,  no masses, no organomegaly        Extremities:   Extremities normal, atraumatic, no cyanosis or edema  Pulses:   2+ and symmetric all extremities  Skin:   Skin color, texture, turgor normal, no rashes or lesions  Lymph nodes:   Cervical, supraclavicular, and axillary nodes normal  Neurologic:   CNII-XII intact. Normal strength, sensation and reflexes      throughout         Assessment & Plan:  .Marland KitchenMcihael was seen today for annual exam.  Diagnoses and all orders for this visit:  Routine physical examination  Influenza vaccine needed -     Flu Vaccine QUAD 6+ mos PF IM (Fluarix Quad PF)  Elevated LDL cholesterol level  Hypothyroidism, unspecified type -     levothyroxine (SYNTHROID, LEVOTHROID) 200 MCG tablet; Take 1 tablet (200 mcg total) by mouth daily before breakfast. -     TSH  Recurrent major depressive disorder, in full remission (HCC) -     sertraline (ZOLOFT) 100 MG tablet; Take 2 tablets  daily for Depression.   .. Depression screen PHQ 2/9 09/06/2017  Decreased Interest 1  Down, Depressed, Hopeless 1  PHQ - 2 Score 2   Discussed labs.  LDL is a little elevated but patient reports not fasting. Overall cardiac risk 7.5 percent. Pt does not want medication.  TSH elevated. Increased levothyroxine to 252mcg recheck labs in 4-6 weeks.   AUA was 2. Pt declined DRE.   He brings in copy of last colonscopy with follow up in 2021.  .Start a regular exercise program and make sure you are eating a healthy diet Try to eat 4 servings of dairy a day or take a calcium supplement (500mg  twice a day). Your vaccines are up to date.

## 2017-09-08 DIAGNOSIS — D369 Benign neoplasm, unspecified site: Secondary | ICD-10-CM | POA: Insufficient documentation

## 2017-09-27 ENCOUNTER — Other Ambulatory Visit: Payer: Self-pay | Admitting: Physician Assistant

## 2017-11-01 ENCOUNTER — Other Ambulatory Visit: Payer: Self-pay | Admitting: Physician Assistant

## 2017-11-01 DIAGNOSIS — E039 Hypothyroidism, unspecified: Secondary | ICD-10-CM

## 2017-11-29 ENCOUNTER — Encounter: Payer: Self-pay | Admitting: Physician Assistant

## 2017-11-29 DIAGNOSIS — E039 Hypothyroidism, unspecified: Secondary | ICD-10-CM

## 2017-11-29 LAB — TSH: TSH: 1.67 mIU/L (ref 0.40–4.50)

## 2017-12-01 MED ORDER — LEVOTHYROXINE SODIUM 200 MCG PO TABS: 200.0000 ug | ORAL_TABLET | Freq: Every day | ORAL | 0 refills | Status: DC

## 2017-12-28 ENCOUNTER — Other Ambulatory Visit: Payer: Self-pay | Admitting: Physician Assistant

## 2017-12-28 DIAGNOSIS — E039 Hypothyroidism, unspecified: Secondary | ICD-10-CM

## 2018-03-11 ENCOUNTER — Other Ambulatory Visit: Payer: Self-pay | Admitting: Physician Assistant

## 2018-03-11 DIAGNOSIS — E039 Hypothyroidism, unspecified: Secondary | ICD-10-CM

## 2018-04-09 ENCOUNTER — Encounter: Payer: Self-pay | Admitting: Physician Assistant

## 2018-04-09 DIAGNOSIS — E039 Hypothyroidism, unspecified: Secondary | ICD-10-CM

## 2018-04-12 LAB — TSH: TSH: 1.24 mIU/L (ref 0.40–4.50)

## 2018-04-13 ENCOUNTER — Other Ambulatory Visit: Payer: Self-pay | Admitting: *Deleted

## 2018-04-13 ENCOUNTER — Other Ambulatory Visit: Payer: Self-pay

## 2018-04-13 DIAGNOSIS — E039 Hypothyroidism, unspecified: Secondary | ICD-10-CM

## 2018-04-13 MED ORDER — LEVOTHYROXINE SODIUM 200 MCG PO TABS: 200.0000 ug | ORAL_TABLET | Freq: Every day | ORAL | 6 refills | Status: DC

## 2018-04-13 MED ORDER — LEVOTHYROXINE SODIUM 200 MCG PO TABS: 200.0000 ug | ORAL_TABLET | Freq: Every day | ORAL | 3 refills | Status: DC

## 2018-04-13 NOTE — Telephone Encounter (Signed)
Call pt: perfect. Ok to refill medication for 1 year.

## 2018-05-21 ENCOUNTER — Encounter: Payer: Self-pay | Admitting: Physician Assistant

## 2018-09-09 ENCOUNTER — Encounter: Payer: Self-pay | Admitting: Physician Assistant

## 2018-09-09 DIAGNOSIS — Z0000 Encounter for general adult medical examination without abnormal findings: Secondary | ICD-10-CM

## 2018-09-09 DIAGNOSIS — E039 Hypothyroidism, unspecified: Secondary | ICD-10-CM

## 2018-09-09 DIAGNOSIS — E7800 Pure hypercholesterolemia, unspecified: Secondary | ICD-10-CM

## 2018-09-09 DIAGNOSIS — Z125 Encounter for screening for malignant neoplasm of prostate: Secondary | ICD-10-CM

## 2018-09-11 ENCOUNTER — Encounter: Payer: BLUE CROSS/BLUE SHIELD | Admitting: Physician Assistant

## 2018-09-18 ENCOUNTER — Ambulatory Visit (INDEPENDENT_AMBULATORY_CARE_PROVIDER_SITE_OTHER): Payer: BLUE CROSS/BLUE SHIELD | Admitting: Physician Assistant

## 2018-09-18 ENCOUNTER — Encounter: Payer: Self-pay | Admitting: Physician Assistant

## 2018-09-18 VITALS — BP 142/90 | HR 69 | Ht 69.0 in | Wt 184.0 lb

## 2018-09-18 DIAGNOSIS — R030 Elevated blood-pressure reading, without diagnosis of hypertension: Secondary | ICD-10-CM

## 2018-09-18 DIAGNOSIS — Z1159 Encounter for screening for other viral diseases: Secondary | ICD-10-CM

## 2018-09-18 DIAGNOSIS — Z0000 Encounter for general adult medical examination without abnormal findings: Secondary | ICD-10-CM

## 2018-09-18 NOTE — Patient Instructions (Signed)

## 2018-09-18 NOTE — Progress Notes (Signed)
Subjective:    Patient ID: Andrew Blake, male    DOB: 20-Oct-1961, 57 y.o.   MRN: [id number]  HPI Pt is a 57 yo male with hx of hypothyroidism, elevated LDL, Depression/GAD who presents to the clinic for CPE.   He has no problems or concerns today.   .. Active Ambulatory Problems    Diagnosis Date Noted  . ANXIETY 05/20/2010  . ALLERGIC RHINITIS 05/20/2010  . SHOULDER PAIN, RIGHT, CHRONIC 08/18/2009  . ADHESIVE CAPSULITIS, RIGHT 08/18/2009  . Plantar fasciitis 01/19/2012  . Abnormality of gait 01/19/2012  . Depression 04/17/2012  . Hypothyroidism 04/21/2012  . Osteoarthritis of right knee 06/28/2016  . Elevated LDL cholesterol level 09/06/2017  . Adenomatous polyp 09/08/2017  . Elevated blood pressure reading 09/19/2018   Resolved Ambulatory Problems    Diagnosis Date Noted  . POISON IVY DERMATITIS 05/20/2010  . Hypothyroid   . Well adult health check 06/11/2015  . Closed displaced fracture of middle phalanx of left index finger with routine healing 06/28/2016   Past Medical History:  Diagnosis Date  . Adhesive capsulitis of right shoulder   . Allergy   . Anxiety    .Marland Kitchen Family History  Problem Relation Age of Onset  . Hypertension Father   . Depression Father   . Depression Brother   . Depression Brother   . Heart attack Paternal Grandmother   . Stroke Paternal Grandfather    .Marland Kitchen Social History   Socioeconomic History  . Marital status: Married    Spouse name: Not on file  . Number of children: Not on file  . Years of education: Not on file  . Highest education level: Not on file  Occupational History  . Not on file  Social Needs  . Financial resource strain: Not on file  . Food insecurity:    Worry: Not on file    Inability: Not on file  . Transportation needs:    Medical: Not on file    Non-medical: Not on file  Tobacco Use  . Smoking status: Former Smoker    Packs/day: 1.00    Years: 10.00    Pack years: 10.00    Types: Cigarettes    Last  attempt to quit: 02/27/1990    Years since quitting: 28.5  . Smokeless tobacco: Never Used  Substance and Sexual Activity  . Alcohol use: Yes    Alcohol/week: 12.0 standard drinks    Types: 12 Cans of beer per week  . Drug use: No  . Sexual activity: Not on file  Lifestyle  . Physical activity:    Days per week: Not on file    Minutes per session: Not on file  . Stress: Not on file  Relationships  . Social connections:    Talks on phone: Not on file    Gets together: Not on file    Attends religious service: Not on file    Active member of club or organization: Not on file    Attends meetings of clubs or organizations: Not on file    Relationship status: Not on file  . Intimate partner violence:    Fear of current or ex partner: Not on file    Emotionally abused: Not on file    Physically abused: Not on file    Forced sexual activity: Not on file  Other Topics Concern  . Not on file  Social History Narrative  . Not on file      Review of Systems  All other  systems reviewed and are negative.      Objective:   Physical Exam  BP (!) 142/90   Pulse 69   Ht 5\' 9"  (1.753 m)   Wt 184 lb (83.5 kg)   BMI 27.17 kg/m   General Appearance:    Alert, cooperative, no distress, appears stated age  Head:    Normocephalic, without obvious abnormality, atraumatic  Eyes:    PERRL, conjunctiva/corneas clear, EOM's intact, fundi    benign, both eyes       Ears:    Normal TM's and external ear canals, both ears  Nose:   Nares normal, septum midline, mucosa normal, no drainage    or sinus tenderness  Throat:   Lips, mucosa, and tongue normal; teeth and gums normal  Neck:   Supple, symmetrical, trachea midline, no adenopathy;       thyroid:  No enlargement/tenderness/nodules; no carotid   bruit or JVD  Back:     Symmetric, no curvature, ROM normal, no CVA tenderness  Lungs:     Clear to auscultation bilaterally, respirations unlabored  Chest wall:    No tenderness or deformity   Heart:    Regular rate and rhythm, S1 and S2 normal, no murmur, rub   or gallop  Abdomen:     Soft, non-tender, bowel sounds active all four quadrants,    no masses, no organomegaly        Extremities:   Extremities normal, atraumatic, no cyanosis or edema  Pulses:   2+ and symmetric all extremities  Skin:   Skin color, texture, turgor normal, no rashes or lesions  Lymph nodes:   Cervical, supraclavicular, and axillary nodes normal  Neurologic:   CNII-XII intact. Normal strength, sensation and reflexes      throughout        Assessment & Plan:  Marland KitchenMarland KitchenDiagnoses and all orders for this visit:  Routine physical examination  Need for hepatitis C screening test -     Hepatitis C Antibody  Elevated blood pressure reading    .Marland Kitchen Depression screen [facility] 2/9 09/18/2018 09/06/2017  Decreased Interest 0 1  Down, Depressed, Hopeless 1 1  PHQ - 2 Score 1 2  Altered sleeping 0 -  Tired, decreased energy 1 -  Change in appetite 0 -  Feeling bad or failure about yourself  0 -  Trouble concentrating 0 -  Moving slowly or fidgety/restless 0 -  Suicidal thoughts 0 -  PHQ-9 Score 2 -  Difficult doing work/chores Not difficult at all -   .Marland KitchenIPSS Questionnaire (AUA-7): Over the past month.   1)  How often have you had a sensation of not emptying your bladder completely after you finish urinating?  1 - Less than 1 time in 5  2)  How often have you had to urinate again less than two hours after you finished urinating? 1 - Less than 1 time in 5  3)  How often have you found you stopped and started again several times when you urinated?  0 - Not at all  4) How difficult have you found it to postpone urination?  1 - Less than 1 time in 5  5) How often have you had a weak urinary stream?  0 - Not at all  6) How often have you had to push or strain to begin urination?  1 - Less than 1 time in 5  7) How many times did you most typically get up to urinate from the time you went  to bed until the time you  got up in the morning?  1 - 1 time  Total score:  0-7 mildly symptomatic   8-19 moderately symptomatic   20-35 severely symptomatic   .Marland KitchenStart a regular exercise program and make sure you are eating a healthy diet Try to eat 4 servings of dairy a day or take a calcium supplement (500mg  twice a day). Your vaccines are up to date.  Colonoscopy up to date.  Pt declined flu shot.  Fasting labs ordered with hep C screening.  Discussed shingrix vaccine. Pt given info. Previously received zostavax.    Mild prostate symptoms. Pt declined need for medication. Does wish to have PSA ordered. Discussed saw palmetto.   BP did not meet goal. Pt declined any medication today. Discussed follow up in next 3 months and to start checking at home/pharmacy. Goal under 140/90. Discussed low SALT diet and weight loss.

## 2018-09-19 ENCOUNTER — Encounter: Payer: Self-pay | Admitting: Physician Assistant

## 2018-09-19 DIAGNOSIS — R030 Elevated blood-pressure reading, without diagnosis of hypertension: Secondary | ICD-10-CM | POA: Insufficient documentation

## 2018-09-28 LAB — LIPID PANEL
Cholesterol: 171 mg/dL (ref <200)
HDL: 40 mg/dL — ABNORMAL LOW (ref >40)
LDL Cholesterol (Calc): 107 mg/dL (calc) — ABNORMAL HIGH
Non-HDL Cholesterol (Calc): 131 mg/dL (calc) — ABNORMAL HIGH (ref <130)
Total CHOL/HDL Ratio: 4.3 (calc) (ref <5.0)
Triglycerides: 128 mg/dL (ref <150)

## 2018-09-28 LAB — COMPLETE METABOLIC PANEL WITH GFR
AG Ratio: 1.8 (calc) (ref 1.0–2.5)
ALT: 15 U/L (ref 9–46)
AST: 14 U/L (ref 10–35)
Albumin: 4.5 g/dL (ref 3.6–5.1)
Alkaline phosphatase (APISO): 65 U/L (ref 40–115)
BUN: 18 mg/dL (ref 7–25)
CO2: 26 mmol/L (ref 20–32)
Calcium: 10 mg/dL (ref 8.6–10.3)
Chloride: 105 mmol/L (ref 98–110)
Creat: 0.97 mg/dL (ref 0.70–1.33)
GFR, Est African American: 100 mL/min/{1.73_m2} (ref >=60)
GFR, Est Non African American: 86 mL/min/{1.73_m2} (ref >=60)
Globulin: 2.5 g/dL (calc) (ref 1.9–3.7)
Glucose, Bld: 91 mg/dL (ref 65–99)
Potassium: 4.3 mmol/L (ref 3.5–5.3)
Sodium: 138 mmol/L (ref 135–146)
Total Bilirubin: 1.1 mg/dL (ref 0.2–1.2)
Total Protein: 7 g/dL (ref 6.1–8.1)

## 2018-09-28 LAB — HEPATITIS C ANTIBODY
Hepatitis C Ab: NONREACTIVE
SIGNAL TO CUT-OFF: 0.01 (ref <1.00)

## 2018-09-28 LAB — TSH: TSH: 1.58 mIU/L (ref 0.40–4.50)

## 2018-09-28 LAB — PSA: PSA: 0.6 ng/mL (ref <=4.0)

## 2018-09-28 NOTE — Progress Notes (Signed)
Call pt: hep C negative.

## 2018-11-14 ENCOUNTER — Other Ambulatory Visit: Payer: Self-pay | Admitting: Physician Assistant

## 2018-11-14 DIAGNOSIS — F3342 Major depressive disorder, recurrent, in full remission: Secondary | ICD-10-CM

## 2019-06-20 ENCOUNTER — Other Ambulatory Visit: Payer: Self-pay | Admitting: Physician Assistant

## 2019-06-20 DIAGNOSIS — E039 Hypothyroidism, unspecified: Secondary | ICD-10-CM

## 2019-09-12 ENCOUNTER — Other Ambulatory Visit: Payer: Self-pay | Admitting: Physician Assistant

## 2019-09-12 DIAGNOSIS — E039 Hypothyroidism, unspecified: Secondary | ICD-10-CM

## 2019-09-13 ENCOUNTER — Encounter: Payer: Self-pay | Admitting: Physician Assistant

## 2019-09-13 DIAGNOSIS — E039 Hypothyroidism, unspecified: Secondary | ICD-10-CM

## 2019-09-13 DIAGNOSIS — Z125 Encounter for screening for malignant neoplasm of prostate: Secondary | ICD-10-CM

## 2019-09-13 DIAGNOSIS — Z0000 Encounter for general adult medical examination without abnormal findings: Secondary | ICD-10-CM

## 2019-09-14 NOTE — Telephone Encounter (Signed)
Labs pended for PCP

## 2019-10-01 NOTE — Telephone Encounter (Signed)
Bernabe,   Cholesterol improved from 1 year ago.  Thyroid stable.  Kidney and liver look good.  CBC looks great.  Glucose up just a hair. Will add a1c to labs to further evaluate for diabetes.   Will see you soon.   Luvenia Starch

## 2019-10-02 LAB — COMPLETE METABOLIC PANEL WITH GFR
AG Ratio: 1.7 (calc) (ref 1.0–2.5)
ALT: 13 U/L (ref 9–46)
AST: 14 U/L (ref 10–35)
Albumin: 4.3 g/dL (ref 3.6–5.1)
Alkaline phosphatase (APISO): 66 U/L (ref 35–144)
BUN: 21 mg/dL (ref 7–25)
CO2: 23 mmol/L (ref 20–32)
Calcium: 9.4 mg/dL (ref 8.6–10.3)
Chloride: 105 mmol/L (ref 98–110)
Creat: 0.9 mg/dL (ref 0.70–1.33)
GFR, Est African American: 109 mL/min/{1.73_m2} (ref >=60)
GFR, Est Non African American: 94 mL/min/{1.73_m2} (ref >=60)
Globulin: 2.6 g/dL (calc) (ref 1.9–3.7)
Glucose, Bld: 102 mg/dL — ABNORMAL HIGH (ref 65–99)
Potassium: 4.5 mmol/L (ref 3.5–5.3)
Sodium: 139 mmol/L (ref 135–146)
Total Bilirubin: 1 mg/dL (ref 0.2–1.2)
Total Protein: 6.9 g/dL (ref 6.1–8.1)

## 2019-10-02 LAB — LIPID PANEL
Cholesterol: 154 mg/dL (ref <200)
HDL: 40 mg/dL (ref >=40)
LDL Cholesterol (Calc): 98 mg/dL (calc)
Non-HDL Cholesterol (Calc): 114 mg/dL (calc) (ref <130)
Total CHOL/HDL Ratio: 3.9 (calc) (ref <5.0)
Triglycerides: 69 mg/dL (ref <150)

## 2019-10-02 LAB — CBC
HCT: 47.5 % (ref 38.5–50.0)
Hemoglobin: 16.2 g/dL (ref 13.2–17.1)
MCH: 31.4 pg (ref 27.0–33.0)
MCHC: 34.1 g/dL (ref 32.0–36.0)
MCV: 92.1 fL (ref 80.0–100.0)
MPV: 10.5 fL (ref 7.5–12.5)
Platelets: 207 10*3/uL (ref 140–400)
RBC: 5.16 10*6/uL (ref 4.20–5.80)
RDW: 12.2 % (ref 11.0–15.0)
WBC: 6.5 10*3/uL (ref 3.8–10.8)

## 2019-10-02 LAB — TSH: TSH: 2.42 mIU/L (ref 0.40–4.50)

## 2019-10-02 LAB — HEMOGLOBIN A1C W/OUT EAG: Hgb A1c MFr Bld: 5.4 % of total Hgb (ref <5.7)

## 2019-10-02 LAB — PSA: PSA: 0.3 ng/mL (ref <=4.0)

## 2019-10-02 NOTE — Telephone Encounter (Signed)
A1C is great at 5.4. normal range and better than 3 years ago. You do not have any signs of diabetes.

## 2019-10-17 ENCOUNTER — Encounter: Payer: Self-pay | Admitting: Physician Assistant

## 2019-10-17 DIAGNOSIS — E039 Hypothyroidism, unspecified: Secondary | ICD-10-CM

## 2019-10-17 DIAGNOSIS — F3342 Major depressive disorder, recurrent, in full remission: Secondary | ICD-10-CM

## 2019-10-17 MED ORDER — SERTRALINE HCL 100 MG PO TABS: ORAL_TABLET | ORAL | 1 refills | Status: AC

## 2019-10-17 MED ORDER — LEVOTHYROXINE SODIUM 200 MCG PO TABS: 200.0000 ug | ORAL_TABLET | Freq: Every day | ORAL | 1 refills | Status: AC
